# Patient Record
Sex: Female | Born: 1940 | Race: White | Hispanic: No | Smoking: Never smoker
Health system: Southern US, Community
[De-identification: ages and names within clinical notes are randomized; demographics above are authoritative.]

## PROBLEM LIST (undated history)

## (undated) DIAGNOSIS — K219 Gastro-esophageal reflux disease without esophagitis: Secondary | ICD-10-CM

## (undated) DIAGNOSIS — J449 Chronic obstructive pulmonary disease, unspecified: Secondary | ICD-10-CM

## (undated) DIAGNOSIS — M1990 Unspecified osteoarthritis, unspecified site: Secondary | ICD-10-CM

## (undated) DIAGNOSIS — C801 Malignant (primary) neoplasm, unspecified: Secondary | ICD-10-CM

## (undated) DIAGNOSIS — J45909 Unspecified asthma, uncomplicated: Secondary | ICD-10-CM

## (undated) HISTORY — PX: BREAST BIOPSY: SHX20

## (undated) HISTORY — DX: Malignant (primary) neoplasm, unspecified: C80.1

## (undated) HISTORY — DX: Unspecified osteoarthritis, unspecified site: M19.90

## (undated) HISTORY — DX: Chronic obstructive pulmonary disease, unspecified: J44.9

## (undated) HISTORY — PX: OTHER SURGICAL HISTORY: SHX169

## (undated) HISTORY — DX: Unspecified asthma, uncomplicated: J45.909

## (undated) HISTORY — PX: CHOLECYSTECTOMY: SHX55

## (undated) HISTORY — PX: ABDOMINAL HYSTERECTOMY: SHX81

## (undated) HISTORY — PX: KIDNEY SURGERY: SHX687

## (undated) HISTORY — DX: Gastro-esophageal reflux disease without esophagitis: K21.9

---

## 2004-06-11 ENCOUNTER — Ambulatory Visit: Payer: Self-pay | Admitting: Gastroenterology

## 2004-06-12 ENCOUNTER — Ambulatory Visit: Payer: Self-pay | Admitting: Gastroenterology

## 2004-06-14 ENCOUNTER — Ambulatory Visit: Payer: Self-pay | Admitting: Internal Medicine

## 2004-08-15 ENCOUNTER — Ambulatory Visit: Payer: Self-pay | Admitting: Internal Medicine

## 2004-08-30 ENCOUNTER — Ambulatory Visit: Payer: Self-pay | Admitting: Surgery

## 2004-09-02 ENCOUNTER — Ambulatory Visit: Payer: Self-pay | Admitting: Surgery

## 2005-01-20 ENCOUNTER — Ambulatory Visit: Payer: Self-pay

## 2005-06-17 ENCOUNTER — Ambulatory Visit: Payer: Self-pay | Admitting: Internal Medicine

## 2005-06-20 ENCOUNTER — Ambulatory Visit: Payer: Self-pay | Admitting: Internal Medicine

## 2005-07-04 ENCOUNTER — Ambulatory Visit: Payer: Self-pay | Admitting: Internal Medicine

## 2005-07-22 ENCOUNTER — Ambulatory Visit: Payer: Self-pay | Admitting: General Surgery

## 2006-08-10 ENCOUNTER — Ambulatory Visit: Payer: Self-pay | Admitting: Internal Medicine

## 2007-05-28 ENCOUNTER — Encounter: Payer: Self-pay | Admitting: Unknown Physician Specialty

## 2007-06-12 ENCOUNTER — Encounter: Payer: Self-pay | Admitting: Unknown Physician Specialty

## 2007-07-12 ENCOUNTER — Encounter: Payer: Self-pay | Admitting: Unknown Physician Specialty

## 2008-03-22 ENCOUNTER — Ambulatory Visit: Payer: Self-pay | Admitting: Internal Medicine

## 2009-01-09 ENCOUNTER — Ambulatory Visit: Payer: Self-pay | Admitting: Unknown Physician Specialty

## 2009-01-29 ENCOUNTER — Ambulatory Visit: Payer: Self-pay | Admitting: Unknown Physician Specialty

## 2009-09-10 ENCOUNTER — Ambulatory Visit: Payer: Self-pay | Admitting: Internal Medicine

## 2010-08-11 HISTORY — PX: BREAST BIOPSY: SHX20

## 2010-09-24 ENCOUNTER — Ambulatory Visit: Payer: Self-pay | Admitting: Internal Medicine

## 2010-09-25 ENCOUNTER — Ambulatory Visit: Payer: Self-pay | Admitting: Internal Medicine

## 2010-10-14 ENCOUNTER — Ambulatory Visit: Payer: Self-pay | Admitting: Surgery

## 2010-10-16 LAB — PATHOLOGY REPORT

## 2011-10-06 ENCOUNTER — Ambulatory Visit: Payer: Self-pay | Admitting: Internal Medicine

## 2012-02-18 ENCOUNTER — Ambulatory Visit: Payer: Self-pay

## 2012-10-08 ENCOUNTER — Emergency Department: Payer: Self-pay | Admitting: Emergency Medicine

## 2012-10-08 LAB — URINALYSIS, COMPLETE
Bilirubin,UR: NEGATIVE
Ketone: NEGATIVE
Nitrite: NEGATIVE
Ph: 7 (ref 4.5–8.0)
Protein: NEGATIVE
RBC,UR: <1 /HPF (ref 0–5)
Specific Gravity: 1.014 (ref 1.003–1.030)
WBC UR: 1 /HPF (ref 0–5)

## 2012-10-08 LAB — TROPONIN I: Troponin-I: <0.02 ng/mL

## 2012-10-08 LAB — COMPREHENSIVE METABOLIC PANEL
Anion Gap: 4 — ABNORMAL LOW (ref 7–16)
BUN: 21 mg/dL — ABNORMAL HIGH (ref 7–18)
Bilirubin,Total: 0.3 mg/dL (ref 0.2–1.0)
Calcium, Total: 9.2 mg/dL (ref 8.5–10.1)
EGFR (Non-African Amer.): 56 — ABNORMAL LOW
Osmolality: 280 (ref 275–301)
Potassium: 3.8 mmol/L (ref 3.5–5.1)
SGOT(AST): 46 U/L — ABNORMAL HIGH (ref 15–37)
SGPT (ALT): 47 U/L (ref 12–78)

## 2012-10-08 LAB — CBC
HCT: 43.6 % (ref 35.0–47.0)
MCH: 32.2 pg (ref 26.0–34.0)
MCV: 99 fL (ref 80–100)
RBC: 4.41 10*6/uL (ref 3.80–5.20)
WBC: 11.6 10*3/uL — ABNORMAL HIGH (ref 3.6–11.0)

## 2013-01-07 ENCOUNTER — Ambulatory Visit: Payer: Self-pay | Admitting: Internal Medicine

## 2013-11-19 DIAGNOSIS — E039 Hypothyroidism, unspecified: Secondary | ICD-10-CM | POA: Insufficient documentation

## 2013-11-19 DIAGNOSIS — J45909 Unspecified asthma, uncomplicated: Secondary | ICD-10-CM | POA: Insufficient documentation

## 2013-11-19 DIAGNOSIS — M059 Rheumatoid arthritis with rheumatoid factor, unspecified: Secondary | ICD-10-CM | POA: Insufficient documentation

## 2013-11-19 DIAGNOSIS — M1990 Unspecified osteoarthritis, unspecified site: Secondary | ICD-10-CM | POA: Insufficient documentation

## 2013-11-19 DIAGNOSIS — G4730 Sleep apnea, unspecified: Secondary | ICD-10-CM | POA: Insufficient documentation

## 2013-11-19 DIAGNOSIS — E079 Disorder of thyroid, unspecified: Secondary | ICD-10-CM | POA: Insufficient documentation

## 2013-11-19 DIAGNOSIS — K219 Gastro-esophageal reflux disease without esophagitis: Secondary | ICD-10-CM | POA: Insufficient documentation

## 2013-12-01 DIAGNOSIS — Z79899 Other long term (current) drug therapy: Secondary | ICD-10-CM | POA: Insufficient documentation

## 2014-05-26 DIAGNOSIS — F325 Major depressive disorder, single episode, in full remission: Secondary | ICD-10-CM | POA: Insufficient documentation

## 2014-06-21 ENCOUNTER — Ambulatory Visit: Payer: Self-pay | Admitting: Internal Medicine

## 2014-08-09 ENCOUNTER — Ambulatory Visit: Payer: Self-pay | Admitting: Internal Medicine

## 2014-08-14 ENCOUNTER — Encounter: Payer: Self-pay | Admitting: General Surgery

## 2014-08-14 ENCOUNTER — Ambulatory Visit (INDEPENDENT_AMBULATORY_CARE_PROVIDER_SITE_OTHER): Payer: Medicare Other | Admitting: General Surgery

## 2014-08-14 VITALS — BP 110/70 | HR 80 | Resp 14 | Ht 63.0 in | Wt 273.0 lb

## 2014-08-14 DIAGNOSIS — R920 Mammographic microcalcification found on diagnostic imaging of breast: Secondary | ICD-10-CM

## 2014-08-14 NOTE — Progress Notes (Signed)
Patient ID: Tamara Cooper, female   DOB: Sep 29, 1940, 74 y.o.   MRN: [id number]  Chief Complaint  Patient presents with  . Other    abnormal mammogram     HPI Tamara Cooper is a 74 y.o. female who presents for a breast evaluation. The most recent mammogram was done on 06/21/14 with additional views of the right breast on 08/09/14.  Patient does perform regular self breast checks and gets regular mammograms done. The patient states that approximately 2 months ago she tripped up the stairs at home. She states she has had soreness in the this area prior to the fall. The soreness comes and goes. She denies any other issues with the breasts at this time. No known family history of breast cancer.    HPI  Past Medical History  Diagnosis Date  . Asthma   . GERD (gastroesophageal reflux disease)   . Arthritis   . Cancer     skin cancer basil cell    Past Surgical History  Procedure Laterality Date  . Abdominal hysterectomy    . Cholecystectomy    . Kidney surgery    . Basil cell excision Right     right breast  . Breast biopsy Right 1997, 2006    Dystrophic calcifications without malignancy.  . Breast biopsy Left 2012    Dystrophic calcifications without malignancy.    Family History  Problem Relation Age of Onset  . Cancer Father     Social History History  Substance Use Topics  . Smoking status: Never Smoker   . Smokeless tobacco: Never Used  . Alcohol Use: No    Allergies  Allergen Reactions  . Doxycycline Nausea Only  . Eggs Or Egg-Derived Products Other (See Comments)    GI bloating  . Sulfa Antibiotics Nausea Only  . Valsartan Cough  . Diltiazem Rash    Current Outpatient Prescriptions  Medication Sig Dispense Refill  . albuterol (PROAIR HFA) 108 (90 BASE) MCG/ACT inhaler TAKE 2 PUFFS 4 TIMES A DAY AS NEEDED    . esomeprazole (NEXIUM) 40 MG capsule Take by mouth.    Marland Kitchen FLUoxetine (PROZAC) 20 MG capsule TAKE 1 CAPSULE EVERY DAY    . folic acid (FOLVITE) 1 MG  tablet 1mg  - 2 tablets by mouth once day.    . furosemide (LASIX) 20 MG tablet     . levothyroxine (SYNTHROID, LEVOTHROID) 125 MCG tablet Take by mouth.    . losartan-hydrochlorothiazide (HYZAAR) 100-25 MG per tablet TAKE ONE TABLET EVERY DAY    . methotrexate (RHEUMATREX) 2.5 MG tablet TAKE 6 TABLETS ON SAME DAY EACH WEEK    . montelukast (SINGULAIR) 10 MG tablet TAKE ONE TABLET BY MOUTH EVERY DAY    . Multiple Vitamin (MULTI-VITAMINS) TABS Take by mouth.    . traMADol (ULTRAM) 50 MG tablet Take by mouth.     No current facility-administered medications for this visit.    Review of Systems Review of Systems  Constitutional: Negative.   Respiratory: Negative.   Cardiovascular: Negative.     Blood pressure 110/70, pulse 80, resp. rate 14, height 5\' 3"  (1.6 m), weight 273 lb (123.832 kg).  Physical Exam Physical Exam  Constitutional: She is oriented to person, place, and time. She appears well-developed and well-nourished.  Neck: Neck supple. No thyromegaly present.  Cardiovascular: Normal rate, regular rhythm and normal heart sounds.   No murmur heard. Pulmonary/Chest: Effort normal. She has rhonchi in the right lower field. Right breast exhibits no inverted nipple,  no mass, no nipple discharge, no skin change and no tenderness. Left breast exhibits no inverted nipple, no mass, no nipple discharge, no skin change and no tenderness.  Lymphadenopathy:    She has no cervical adenopathy.    She has no axillary adenopathy.  Neurological: She is alert and oriented to person, place, and time.  Skin: Skin is warm and dry.    Data Reviewed Biopsy results from 1997, 2006 and 2012 were reviewed.  Mammograms from 06/21/2014 as well as focal spot compression views of the right breast dated 08/09/2014 were reviewed. BI-RADS-4.  Independent review protamine 2012 and 2014 shows a slowly developing area of microcalcifications of similar area but with increasing number of  calcifications.  Assessment    Right breast microcalcifications, history multiple falls, multiple previous biopsies.    Plan    Options for management were reviewed: 1) early stereotactic biopsy versus 2) a 6 month follow-up exam. His of each plan were reviewed. Little risk for missed malignancy or upstaging in short interval follow-up. At this time the patient desires observation. We'll arrange for a follow-up right breast diagnostic mammogram in May 2016 with an office visit to follow.    PCP/Ref MD:  Irish Elders 08/14/2014, 7:39 PM

## 2014-08-14 NOTE — Patient Instructions (Addendum)
Patient to return in 4 months with right diagnostic mammogram. Continue self breast exams. Call office for any new breast issues or concerns.

## 2014-08-18 ENCOUNTER — Ambulatory Visit: Payer: Self-pay | Admitting: Gastroenterology

## 2014-11-24 ENCOUNTER — Other Ambulatory Visit: Payer: Self-pay | Admitting: General Surgery

## 2014-11-24 DIAGNOSIS — R921 Mammographic calcification found on diagnostic imaging of breast: Secondary | ICD-10-CM

## 2014-12-04 LAB — SURGICAL PATHOLOGY

## 2014-12-25 ENCOUNTER — Other Ambulatory Visit: Payer: Self-pay | Admitting: General Surgery

## 2014-12-25 ENCOUNTER — Ambulatory Visit: Admission: RE | Admit: 2014-12-25 | Payer: Self-pay | Source: Ambulatory Visit

## 2014-12-25 ENCOUNTER — Ambulatory Visit
Admission: RE | Admit: 2014-12-25 | Discharge: 2014-12-25 | Disposition: A | Discharge status: Home / Self Care | Payer: Medicare Other | Source: Ambulatory Visit | Attending: General Surgery | Admitting: General Surgery

## 2014-12-25 DIAGNOSIS — R921 Mammographic calcification found on diagnostic imaging of breast: Secondary | ICD-10-CM | POA: Insufficient documentation

## 2014-12-26 DIAGNOSIS — M069 Rheumatoid arthritis, unspecified: Secondary | ICD-10-CM | POA: Insufficient documentation

## 2015-01-03 ENCOUNTER — Encounter: Payer: Self-pay | Admitting: General Surgery

## 2015-01-03 ENCOUNTER — Ambulatory Visit: Payer: Medicare Other | Admitting: General Surgery

## 2015-01-03 ENCOUNTER — Ambulatory Visit (INDEPENDENT_AMBULATORY_CARE_PROVIDER_SITE_OTHER): Payer: Medicare Other | Admitting: General Surgery

## 2015-01-03 VITALS — BP 160/78 | HR 74 | Resp 16 | Ht 63.0 in | Wt 271.0 lb

## 2015-01-03 DIAGNOSIS — R920 Mammographic microcalcification found on diagnostic imaging of breast: Secondary | ICD-10-CM

## 2015-01-03 NOTE — Progress Notes (Signed)
Patient ID: Tamara Cooper, female   DOB: 02-11-1941, 74 y.o.   MRN: [id number]  Chief Complaint  Patient presents with  . Follow-up    mammogram    HPI Tamara Cooper is a 74 y.o. female who presents for a breast evaluation. The most recent right diagnotic mammogram was done on 12/25/14.  Patient does perform regular self breast checks and gets regular mammograms done.    HPI  Past Medical History  Diagnosis Date  . Asthma   . GERD (gastroesophageal reflux disease)   . Arthritis   . Cancer     skin cancer basil cell    Past Surgical History  Procedure Laterality Date  . Abdominal hysterectomy    . Cholecystectomy    . Kidney surgery    . Basil cell excision Right     right breast  . Breast biopsy Right 1997, 2006    Dystrophic calcifications without malignancy.  . Breast biopsy Left 2012    Dystrophic calcifications without malignancy.    Family History  Problem Relation Age of Onset  . Cancer Father     Social History History  Substance Use Topics  . Smoking status: Never Smoker   . Smokeless tobacco: Never Used  . Alcohol Use: No    Allergies  Allergen Reactions  . Doxycycline Nausea Only  . Eggs Or Egg-Derived Products Other (See Comments)    GI bloating  . Sulfa Antibiotics Nausea Only  . Valsartan Cough  . Diltiazem Rash    Current Outpatient Prescriptions  Medication Sig Dispense Refill  . albuterol (PROAIR HFA) 108 (90 BASE) MCG/ACT inhaler TAKE 2 PUFFS 4 TIMES A DAY AS NEEDED    . esomeprazole (NEXIUM) 40 MG capsule Take by mouth.    Marland Kitchen FLUoxetine (PROZAC) 20 MG capsule TAKE 1 CAPSULE EVERY DAY    . folic acid (FOLVITE) 1 MG tablet 1mg  - 2 tablets by mouth once day.    . furosemide (LASIX) 20 MG tablet     . levothyroxine (SYNTHROID, LEVOTHROID) 125 MCG tablet Take by mouth.    . losartan-hydrochlorothiazide (HYZAAR) 100-25 MG per tablet TAKE ONE TABLET EVERY DAY    . methotrexate (RHEUMATREX) 2.5 MG tablet TAKE 6 TABLETS ON SAME DAY EACH WEEK     . montelukast (SINGULAIR) 10 MG tablet TAKE ONE TABLET BY MOUTH EVERY DAY    . Multiple Vitamin (MULTI-VITAMINS) TABS Take by mouth.    . traMADol (ULTRAM) 50 MG tablet Take by mouth.     No current facility-administered medications for this visit.    Review of Systems Review of Systems  Constitutional: Negative.   Respiratory: Negative.   Cardiovascular: Negative.     Blood pressure 160/78, pulse 74, resp. rate 16, height 5\' 3"  (1.6 m), weight 271 lb (122.925 kg).  Physical Exam Physical Exam  Constitutional: She is oriented to person, place, and time. She appears well-developed and well-nourished.  Eyes: Conjunctivae are normal. No scleral icterus.  Neck: Neck supple.  Cardiovascular: Normal rate, regular rhythm and normal heart sounds.   Pulmonary/Chest: Effort normal and breath sounds normal. Right breast exhibits no inverted nipple, no mass, no nipple discharge, no skin change and no tenderness. Left breast exhibits no inverted nipple, no mass, no nipple discharge, no skin change and no tenderness.  Lymphadenopathy:    She has no cervical adenopathy.    She has no axillary adenopathy.  Neurological: She is alert and oriented to person, place, and time.  Skin: Skin is warm  and dry.    Data Reviewed Right breast diagnostic mammogram dated 12/25/2014 was independently reviewed. Radiologist report reviewed.  Stable to possibly increased dystrophic calcifications, BI-RADS-4.  Assessment    Right breast microcalcifications, developing over 3 years.    Plan    We had a long discussion including the pros and cons of early biopsy versus an observation.  The patient has had 3 previous biopsies for suspicious microcalcifications, no previous pathology. At this time, we'll plan for a follow-up exam in 6 months.   Patient to return in November 2016 for a bilateral diagnotic mammogram.     PCP:  Cephas Darby, [place] Gleason 01/04/2015, 7:52 AM

## 2015-01-03 NOTE — Patient Instructions (Signed)
Patient to return in November 2016 for a bilateral diagnotic mammogram.

## 2015-03-23 DIAGNOSIS — Z0000 Encounter for general adult medical examination without abnormal findings: Secondary | ICD-10-CM | POA: Insufficient documentation

## 2015-05-03 ENCOUNTER — Other Ambulatory Visit: Payer: Self-pay

## 2015-05-03 DIAGNOSIS — R920 Mammographic microcalcification found on diagnostic imaging of breast: Secondary | ICD-10-CM

## 2015-06-01 ENCOUNTER — Other Ambulatory Visit: Payer: Self-pay | Admitting: Family Medicine

## 2015-06-01 DIAGNOSIS — M25562 Pain in left knee: Secondary | ICD-10-CM

## 2015-06-01 DIAGNOSIS — S8002XA Contusion of left knee, initial encounter: Secondary | ICD-10-CM

## 2015-06-04 ENCOUNTER — Ambulatory Visit
Admission: RE | Admit: 2015-06-04 | Discharge: 2015-06-04 | Disposition: A | Discharge status: Home / Self Care | Payer: Medicare Other | Source: Ambulatory Visit | Attending: Family Medicine | Admitting: Family Medicine

## 2015-06-04 DIAGNOSIS — S8002XA Contusion of left knee, initial encounter: Secondary | ICD-10-CM | POA: Insufficient documentation

## 2015-06-04 DIAGNOSIS — S83232A Complex tear of medial meniscus, current injury, left knee, initial encounter: Secondary | ICD-10-CM | POA: Insufficient documentation

## 2015-06-04 DIAGNOSIS — R262 Difficulty in walking, not elsewhere classified: Secondary | ICD-10-CM | POA: Diagnosis present

## 2015-06-04 DIAGNOSIS — M67864 Other specified disorders of tendon, left knee: Secondary | ICD-10-CM | POA: Insufficient documentation

## 2015-06-04 DIAGNOSIS — M948X6 Other specified disorders of cartilage, lower leg: Secondary | ICD-10-CM | POA: Insufficient documentation

## 2015-06-04 DIAGNOSIS — M25562 Pain in left knee: Secondary | ICD-10-CM | POA: Diagnosis present

## 2015-06-25 ENCOUNTER — Other Ambulatory Visit: Payer: Self-pay | Admitting: Rheumatology

## 2015-06-25 ENCOUNTER — Encounter: Payer: Self-pay | Admitting: General Surgery

## 2015-06-25 DIAGNOSIS — R945 Abnormal results of liver function studies: Secondary | ICD-10-CM

## 2015-06-25 DIAGNOSIS — R7989 Other specified abnormal findings of blood chemistry: Secondary | ICD-10-CM

## 2015-06-26 ENCOUNTER — Ambulatory Visit
Admission: RE | Admit: 2015-06-26 | Discharge: 2015-06-26 | Disposition: A | Discharge status: Home / Self Care | Payer: Medicare Other | Source: Ambulatory Visit | Attending: General Surgery | Admitting: General Surgery

## 2015-06-26 DIAGNOSIS — R920 Mammographic microcalcification found on diagnostic imaging of breast: Secondary | ICD-10-CM

## 2015-06-26 DIAGNOSIS — R921 Mammographic calcification found on diagnostic imaging of breast: Secondary | ICD-10-CM | POA: Insufficient documentation

## 2015-06-29 ENCOUNTER — Ambulatory Visit: Payer: Medicare Other

## 2015-07-02 ENCOUNTER — Ambulatory Visit (INDEPENDENT_AMBULATORY_CARE_PROVIDER_SITE_OTHER): Payer: Medicare Other | Admitting: General Surgery

## 2015-07-02 ENCOUNTER — Encounter: Payer: Self-pay | Admitting: General Surgery

## 2015-07-02 VITALS — BP 140/73 | HR 72 | Resp 18 | Ht 63.0 in | Wt 278.0 lb

## 2015-07-02 DIAGNOSIS — R920 Mammographic microcalcification found on diagnostic imaging of breast: Secondary | ICD-10-CM | POA: Diagnosis not present

## 2015-07-02 NOTE — Patient Instructions (Addendum)
Follow up in one year  Continue self breast exams. Call office for any new breast issues or concerns.  

## 2015-07-02 NOTE — Progress Notes (Signed)
Patient ID: Tamara Cooper, female   DOB: 1940-10-30, 74 y.o.   MRN: [id number]  Chief Complaint  Patient presents with  . Follow-up    mammogram    HPI Tamara Cooper is a 74 y.o. female who presents for a breast evaluation. The most recent bilateral mammogram was done on 06/26/15. Patient does perform regular self breast checks and gets regular mammograms done. She denies any new breast problems.    HPI  Past Medical History  Diagnosis Date  . Asthma   . GERD (gastroesophageal reflux disease)   . Arthritis   . Cancer ([place])     skin cancer basil cell    Past Surgical History  Procedure Laterality Date  . Abdominal hysterectomy    . Cholecystectomy    . Kidney surgery    . Basil cell excision Right     right breast  . Breast biopsy Right 1997, 2006    Dystrophic calcifications without malignancy.  . Breast biopsy Left 2012    Dystrophic calcifications without malignancy.    Family History  Problem Relation Age of Onset  . Cancer Father     Social History Social History  Substance Use Topics  . Smoking status: Never Smoker   . Smokeless tobacco: Never Used  . Alcohol Use: No    Allergies  Allergen Reactions  . Doxycycline Nausea Only  . Eggs Or Egg-Derived Products Other (See Comments)    GI bloating  . Sulfa Antibiotics Nausea Only  . Valsartan Cough  . Diltiazem Rash    Current Outpatient Prescriptions  Medication Sig Dispense Refill  . albuterol (PROAIR HFA) 108 (90 BASE) MCG/ACT inhaler TAKE 2 PUFFS 4 TIMES A DAY AS NEEDED    . esomeprazole (NEXIUM) 40 MG capsule Take by mouth.    Marland Kitchen FLUoxetine (PROZAC) 20 MG capsule TAKE 1 CAPSULE EVERY DAY    . folic acid (FOLVITE) 1 MG tablet 1mg  - 2 tablets by mouth once day.    . furosemide (LASIX) 20 MG tablet     . levothyroxine (SYNTHROID, LEVOTHROID) 125 MCG tablet Take by mouth.    . losartan-hydrochlorothiazide (HYZAAR) 100-25 MG per tablet TAKE ONE TABLET EVERY DAY    . methotrexate (RHEUMATREX) 2.5 MG  tablet TAKE 6 TABLETS ON SAME DAY EACH WEEK    . montelukast (SINGULAIR) 10 MG tablet TAKE ONE TABLET BY MOUTH EVERY DAY    . Multiple Vitamin (MULTI-VITAMINS) TABS Take by mouth.    . traMADol (ULTRAM) 50 MG tablet Take by mouth.     No current facility-administered medications for this visit.    Review of Systems Review of Systems  Constitutional: Negative.   Respiratory: Positive for shortness of breath (mild, attributed to asthma. Exacerbation with recent weather change.).   Cardiovascular: Negative.     Blood pressure 140/73, pulse 72, resp. rate 18, height 5\' 3"  (1.6 m), weight 278 lb (126.1 kg).   The patient's weight is up 5 pounds from her January 2016 exam.  Physical Exam Physical Exam  Constitutional: She is oriented to person, place, and time. She appears well-developed and well-nourished.  Eyes: Conjunctivae are normal. No scleral icterus.  Neck: Neck supple.  Cardiovascular: Normal rate, regular rhythm and normal heart sounds.   Pulmonary/Chest: Effort normal. She has rales in the right lower field. Right breast exhibits no inverted nipple, no mass, no nipple discharge, no skin change and no tenderness. Left breast exhibits no inverted nipple, no mass, no nipple discharge, no skin change and  no tenderness. Breasts are asymmetrical (right 1 cup size larger than left).  Lymphadenopathy:    She has no cervical adenopathy.    She has no axillary adenopathy.  Neurological: She is alert and oriented to person, place, and time.  Skin: Skin is warm and dry.  Psychiatric: She has a normal mood and affect.    Data Reviewed Bilateral diagnostic mammograms dated 06/26/2015 were independently reviewed and compared to previous studies. Stable microcalcifications in the right breast. Scattered calcifications in the left. Body of report recommends 1 year follow-up, final BIRAD-3.  Assessment    Microcalcifications of the right breast. Few rales in the right lower lobe, left  clear. Main airways clear.    Plan    Options for management were reviewed: 1) early follow-up right breast mammogram to complete 6 month follow-ups 2 years versus 2) final follow-up diagnostic mammogram in one year. The patient and myself are comfortable with the latter.  We'll arrange for bilateral diagnostic mammograms in one year with office visit to follow.  The patient will continue her regular inhalers and report worsening shortness of breath to Dr. Ouida Sills.    PCP: Vergia Alberts 07/02/2015, 10:51 AM

## 2015-07-12 ENCOUNTER — Ambulatory Visit
Admission: RE | Admit: 2015-07-12 | Discharge: 2015-07-12 | Disposition: A | Discharge status: Home / Self Care | Payer: Medicare Other | Source: Ambulatory Visit | Attending: Rheumatology | Admitting: Rheumatology

## 2015-07-12 DIAGNOSIS — R7989 Other specified abnormal findings of blood chemistry: Secondary | ICD-10-CM | POA: Diagnosis present

## 2015-07-12 DIAGNOSIS — K7689 Other specified diseases of liver: Secondary | ICD-10-CM | POA: Insufficient documentation

## 2015-07-12 DIAGNOSIS — R945 Abnormal results of liver function studies: Secondary | ICD-10-CM

## 2015-07-18 ENCOUNTER — Other Ambulatory Visit: Payer: Self-pay | Admitting: Rheumatology

## 2015-07-18 DIAGNOSIS — R93429 Abnormal radiologic findings on diagnostic imaging of unspecified kidney: Secondary | ICD-10-CM

## 2015-08-15 ENCOUNTER — Ambulatory Visit
Admission: RE | Admit: 2015-08-15 | Discharge: 2015-08-15 | Disposition: A | Discharge status: Home / Self Care | Payer: Medicare Other | Source: Ambulatory Visit | Attending: Rheumatology | Admitting: Rheumatology

## 2015-08-15 DIAGNOSIS — K760 Fatty (change of) liver, not elsewhere classified: Secondary | ICD-10-CM | POA: Diagnosis not present

## 2015-08-15 DIAGNOSIS — R93429 Abnormal radiologic findings on diagnostic imaging of unspecified kidney: Secondary | ICD-10-CM | POA: Diagnosis present

## 2015-08-15 DIAGNOSIS — R160 Hepatomegaly, not elsewhere classified: Secondary | ICD-10-CM | POA: Insufficient documentation

## 2015-08-15 MED ORDER — GADOBENATE DIMEGLUMINE 529 MG/ML IV SOLN
10.0000 mL | Freq: Once | INTRAVENOUS | Status: AC | PRN
  Administered 2015-08-15: 10 mL via INTRAVENOUS

## 2015-08-28 ENCOUNTER — Encounter: Payer: Self-pay | Admitting: Urology

## 2015-08-28 ENCOUNTER — Ambulatory Visit (INDEPENDENT_AMBULATORY_CARE_PROVIDER_SITE_OTHER): Payer: Medicare Other | Admitting: Urology

## 2015-08-28 VITALS — BP 164/80 | HR 98 | Resp 18 | Ht 63.0 in | Wt 276.7 lb

## 2015-08-28 DIAGNOSIS — N2889 Other specified disorders of kidney and ureter: Secondary | ICD-10-CM | POA: Diagnosis not present

## 2015-08-28 DIAGNOSIS — R3915 Urgency of urination: Secondary | ICD-10-CM | POA: Diagnosis not present

## 2015-08-28 LAB — URINALYSIS, COMPLETE
Bilirubin, UA: NEGATIVE
Glucose, UA: NEGATIVE
Ketones, UA: NEGATIVE
LEUKOCYTES UA: NEGATIVE
Nitrite, UA: NEGATIVE
PH UA: 7 (ref 5.0–7.5)
PROTEIN UA: NEGATIVE
RBC, UA: NEGATIVE
SPEC GRAV UA: 1.01 (ref 1.005–1.030)
Urobilinogen, Ur: 0.2 mg/dL (ref 0.2–1.0)

## 2015-08-28 LAB — MICROSCOPIC EXAMINATION: RBC, UA: NONE SEEN /hpf (ref 0–?)

## 2015-08-28 NOTE — Progress Notes (Signed)
08/28/2015 3:17 PM   Tamara Cooper 06-06-1941 [id number]  Referring provider: Kirk Ruths, MD [facility] [place], [place] [address]  Chief Complaint  Patient presents with  . Other  . Renal mass    HPI: Tamara Cooper is a 75yo seen in consultation today for a left renal mass. Her renal mass was found on workup for elevated liver enzymes and elevated creatinine. 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 It has similar echotexture to the upper pole. She has a hx of open renal stone extraction when she was in her 20-30s. 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   She has urgency daily. She has frequency q6-8 hours. She denies dysuria or hematuria.    PMH: Past Medical History  Diagnosis Date  . Asthma   . GERD (gastroesophageal reflux disease)   . Arthritis   . Cancer ([place])     skin cancer basil cell    Surgical History: Past Surgical History  Procedure Laterality Date  . Abdominal hysterectomy    . Cholecystectomy    . Kidney surgery    . Basil cell excision Right     right breast  . Breast biopsy Right 1997, 2006    Dystrophic calcifications without malignancy.  . Breast biopsy Left 2012    Dystrophic calcifications without malignancy.    Home Medications:    Medication List       This list is accurate as of: 08/28/15  3:17 PM.  Always use your most recent med list.               esomeprazole 40 MG capsule  Commonly known as:  NEXIUM  Take by mouth.     FLUoxetine 20 MG capsule  Commonly known as:  PROZAC  Reported on [id number]     folic acid 1 MG tablet  Commonly known as:  FOLVITE  1mg  - 2 tablets by mouth once day.     furosemide 20 MG tablet  Commonly known as:  LASIX     levothyroxine 125 MCG tablet  Commonly known as:  SYNTHROID, LEVOTHROID  Take by mouth.     losartan-hydrochlorothiazide 100-25 MG tablet    Commonly known as:  HYZAAR  TAKE ONE TABLET EVERY DAY     montelukast 10 MG tablet  Commonly known as:  SINGULAIR     MULTI-VITAMINS Tabs  Take by mouth.     PROAIR HFA 108 (90 Base) MCG/ACT inhaler  Generic drug:  albuterol  TAKE 2 PUFFS 4 TIMES A DAY AS NEEDED     traMADol 50 MG tablet  Commonly known as:  ULTRAM  Take by mouth.     VITAMIN D-1000 MAX ST 1000 units tablet  Generic drug:  Cholecalciferol  Take by mouth.        Allergies:  Allergies  Allergen Reactions  . Doxycycline Nausea Only  . Eggs Or Egg-Derived Products Other (See Comments)    GI bloating  . Sulfa Antibiotics Nausea Only  . Valsartan Cough and Other (See Comments)  . Aspirin Rash    Mouth ulcers  . Diltiazem Rash  . Diltiazem Hcl Rash    Family History: Family History  Problem Relation Age of Onset  . Cancer Father     Social History:  reports that she has never smoked. She has never used smokeless tobacco. She  reports that she does not drink alcohol or use illicit drugs.  ROS: UROLOGY Frequent Urination?: No Hard to postpone urination?: Yes Burning/pain with urination?: Yes Get up at night to urinate?: No Leakage of urine?: Yes Urine stream starts and stops?: No Trouble starting stream?: No Do you have to strain to urinate?: No Blood in urine?: No Urinary tract infection?: Yes Sexually transmitted disease?: No Injury to kidneys or bladder?: No Painful intercourse?: No Weak stream?: No Currently pregnant?: No Vaginal bleeding?: No Last menstrual period?: n  Gastrointestinal Nausea?: Yes Vomiting?: No Indigestion/heartburn?: Yes Diarrhea?: No Constipation?: No  Constitutional Fever: No Night sweats?: No Weight loss?: No Fatigue?: Yes  Skin Skin rash/lesions?: Yes Itching?: Yes  Eyes Blurred vision?: No Double vision?: No  Ears/Nose/Throat Sore throat?: No Sinus problems?: Yes  Hematologic/Lymphatic Swollen glands?: No Easy bruising?:  Yes  Cardiovascular Leg swelling?: Yes Chest pain?: No  Respiratory Cough?: Yes Shortness of breath?: Yes  Endocrine Excessive thirst?: No  Musculoskeletal Back pain?: No Joint pain?: Yes  Neurological Headaches?: Yes Dizziness?: Yes  Psychologic Depression?: Yes Anxiety?: No  Physical Exam: BP 164/80 mmHg  Pulse 98  Resp 18  Ht 5\' 3"  (1.6 m)  Wt 125.51 kg (276 lb 11.2 oz)  BMI 49.03 kg/m2  Constitutional:  Alert and oriented, No acute distress. HEENT: [place] AT, moist mucus membranes.  Trachea midline, no masses. Cardiovascular: No clubbing, cyanosis, or edema. Respiratory: Normal respiratory effort, no increased work of breathing. GI: Abdomen is soft, nontender, nondistended, no abdominal masses GU: No CVA tenderness.  Skin: No rashes, bruises or suspicious lesions. Lymph: No cervical or inguinal adenopathy. Neurologic: Grossly intact, no focal deficits, moving all 4 extremities. Psychiatric: Normal mood and affect.  Laboratory Data: Lab Results  Component Value Date   WBC 11.6* 10/08/2012   HGB 14.2 10/08/2012   HCT 43.6 10/08/2012   MCV 99 10/08/2012   PLT 277 10/08/2012    Lab Results  Component Value Date   CREATININE 0.99 10/08/2012    No results found for: PSA  No results found for: TESTOSTERONE  No results found for: HGBA1C  Urinalysis    Component Value Date/Time   COLORURINE Yellow 10/08/2012 1828   APPEARANCEUR Clear 10/08/2012 1828   LABSPEC 1.014 10/08/2012 1828   PHURINE 7.0 10/08/2012 1828   GLUCOSEU Negative 10/08/2012 1828   HGBUR Negative 10/08/2012 1828   BILIRUBINUR Negative 10/08/2012 1828   KETONESUR Negative 10/08/2012 1828   PROTEINUR Negative 10/08/2012 1828   NITRITE Negative 10/08/2012 1828   LEUKOCYTESUR Negative 10/08/2012 1828    Pertinent Imaging: MRI abd  Assessment & Plan:    1. Renal mass -MRI abd in 6-9 months - Urinalysis, Complete  2. Urinary urgency -timed voiding  No Follow-up on  file.  Cleon Gustin, [facility] [address] [place], [place] [address] [phone]

## 2015-08-29 DIAGNOSIS — N2889 Other specified disorders of kidney and ureter: Secondary | ICD-10-CM | POA: Insufficient documentation

## 2015-08-29 DIAGNOSIS — R3915 Urgency of urination: Secondary | ICD-10-CM | POA: Insufficient documentation

## 2016-02-25 ENCOUNTER — Ambulatory Visit
Admission: RE | Admit: 2016-02-25 | Discharge: 2016-02-25 | Disposition: A | Discharge status: Home / Self Care | Payer: Medicare Other | Source: Ambulatory Visit | Attending: Urology | Admitting: Urology

## 2016-02-25 DIAGNOSIS — N2889 Other specified disorders of kidney and ureter: Secondary | ICD-10-CM

## 2016-03-04 ENCOUNTER — Ambulatory Visit: Payer: Medicare Other

## 2016-03-11 ENCOUNTER — Ambulatory Visit
Admission: RE | Admit: 2016-03-11 | Discharge: 2016-03-11 | Disposition: A | Discharge status: Home / Self Care | Payer: Medicare Other | Source: Ambulatory Visit | Attending: Urology | Admitting: Urology

## 2016-03-11 DIAGNOSIS — Z9049 Acquired absence of other specified parts of digestive tract: Secondary | ICD-10-CM | POA: Insufficient documentation

## 2016-03-11 DIAGNOSIS — N2889 Other specified disorders of kidney and ureter: Secondary | ICD-10-CM | POA: Insufficient documentation

## 2016-03-11 DIAGNOSIS — K760 Fatty (change of) liver, not elsewhere classified: Secondary | ICD-10-CM | POA: Insufficient documentation

## 2016-03-11 MED ORDER — GADOBENATE DIMEGLUMINE 529 MG/ML IV SOLN
20.0000 mL | Freq: Once | INTRAVENOUS | Status: AC | PRN
  Administered 2016-03-11: 20 mL via INTRAVENOUS

## 2016-03-18 ENCOUNTER — Encounter: Payer: Self-pay | Admitting: Urology

## 2016-03-18 ENCOUNTER — Ambulatory Visit (INDEPENDENT_AMBULATORY_CARE_PROVIDER_SITE_OTHER): Payer: Medicare Other | Admitting: Urology

## 2016-03-18 VITALS — BP 147/86 | HR 84 | Ht 63.0 in | Wt 268.0 lb

## 2016-03-18 DIAGNOSIS — N2889 Other specified disorders of kidney and ureter: Secondary | ICD-10-CM

## 2016-03-18 NOTE — Progress Notes (Signed)
03/18/2016 9:55 AM   Tamara Cooper Dec 25, 1940 [id number]  Referring provider: Kirk Ruths, MD [facility] [place], [place] [address]  No chief complaint on file.   HPI: Ms Ashworth is a 75yo seen in followup for a left renal mass. She underwent MRI which was not concerning for a left renal mass. She underwent open renal surgery several years ago nephrolithiasis.  She has urgency and occasional urge incontinence. She has frequency q 5 hours.    She takes lasix 40mg    PMH: Past Medical History:  Diagnosis Date  . Arthritis   . Asthma   . Cancer ([place])    skin cancer basil cell  . GERD (gastroesophageal reflux disease)     Surgical History: Past Surgical History:  Procedure Laterality Date  . ABDOMINAL HYSTERECTOMY    . basil cell excision Right    right breast  . BREAST BIOPSY Right 1997, 2006   Dystrophic calcifications without malignancy.  Marland Kitchen BREAST BIOPSY Left 2012   Dystrophic calcifications without malignancy.  . CHOLECYSTECTOMY    . KIDNEY SURGERY      Home Medications:    Medication List       Accurate as of 03/18/16  9:55 AM. Always use your most recent med list.          esomeprazole 40 MG capsule Commonly known as:  NEXIUM Take by mouth.   FLUoxetine 20 MG capsule Commonly known as:  PROZAC Reported on [id number]   folic acid 1 MG tablet Commonly known as:  FOLVITE 1mg  - 2 tablets by mouth once day.   furosemide 20 MG tablet Commonly known as:  LASIX   levothyroxine 125 MCG tablet Commonly known as:  SYNTHROID, LEVOTHROID Take by mouth.   losartan-hydrochlorothiazide 100-25 MG tablet Commonly known as:  HYZAAR TAKE ONE TABLET EVERY DAY   montelukast 10 MG tablet Commonly known as:  SINGULAIR   MULTI-VITAMINS Tabs Take by mouth.   PROAIR HFA 108 (90 Base) MCG/ACT inhaler Generic drug:  albuterol TAKE 2 PUFFS 4 TIMES A DAY AS NEEDED   traMADol 50 MG tablet Commonly known as:  ULTRAM Take by  mouth.   VITAMIN D-1000 MAX ST 1000 units tablet Generic drug:  Cholecalciferol Take by mouth.       Allergies:  Allergies  Allergen Reactions  . Doxycycline Nausea Only  . Eggs Or Egg-Derived Products Other (See Comments)    GI bloating  . Sulfa Antibiotics Nausea Only  . Valsartan Cough and Other (See Comments)  . Aspirin Rash    Mouth ulcers  . Diltiazem Rash  . Diltiazem Hcl Rash    Family History: Family History  Problem Relation Age of Onset  . Cancer Father     Social History:  reports that she has never smoked. She has never used smokeless tobacco. She reports that she does not drink alcohol or use drugs.  ROS: UROLOGY Frequent Urination?: No Hard to postpone urination?: No Burning/pain with urination?: No Get up at night to urinate?: Yes Leakage of urine?: Yes Urine stream starts and stops?: No Trouble starting stream?: No Do you have to strain to urinate?: No Blood in urine?: No Urinary tract infection?: No Sexually transmitted disease?: No Injury to kidneys or bladder?: No Painful intercourse?: No Weak stream?: No Currently pregnant?: No Vaginal bleeding?: No Last menstrual period?: No  Gastrointestinal Nausea?: Yes Vomiting?: No Indigestion/heartburn?: No Diarrhea?: No Constipation?: No  Constitutional Fever: No Night sweats?: No Weight  loss?: No Fatigue?: Yes  Skin Skin rash/lesions?: Yes Itching?: Yes  Eyes Blurred vision?: No Double vision?: No  Ears/Nose/Throat Sore throat?: No Sinus problems?: Yes  Hematologic/Lymphatic Swollen glands?: No Easy bruising?: No  Cardiovascular Leg swelling?: Yes Chest pain?: No  Respiratory Cough?: Yes Shortness of breath?: Yes  Endocrine Excessive thirst?: No  Musculoskeletal Back pain?: Yes Joint pain?: Yes  Neurological Headaches?: Yes Dizziness?: No  Psychologic Depression?: Yes Anxiety?: No  Physical Exam: BP (!) 147/86   Pulse 84   Ht 5\' 3"  (1.6 m)   Wt  121.6 kg (268 lb)   BMI 47.47 kg/m   Constitutional:  Alert and oriented, No acute distress. HEENT: [place] AT, moist mucus membranes.  Trachea midline, no masses. Cardiovascular: No clubbing, cyanosis, or edema. Respiratory: Normal respiratory effort, no increased work of breathing. GI: Abdomen is soft, nontender, nondistended, no abdominal masses GU: No CVA tenderness.  Skin: No rashes, bruises or suspicious lesions. Lymph: No cervical or inguinal adenopathy. Neurologic: Grossly intact, no focal deficits, moving all 4 extremities. Psychiatric: Normal mood and affect.  Laboratory Data: Lab Results  Component Value Date   WBC 11.6 (H) 10/08/2012   HGB 14.2 10/08/2012   HCT 43.6 10/08/2012   MCV 99 10/08/2012   PLT 277 10/08/2012    Lab Results  Component Value Date   CREATININE 0.99 10/08/2012    No results found for: PSA  No results found for: TESTOSTERONE  No results found for: HGBA1C  Urinalysis    Component Value Date/Time   COLORURINE Yellow 10/08/2012 1828   APPEARANCEUR Clear 08/28/2015 1509   LABSPEC 1.014 10/08/2012 1828   PHURINE 7.0 10/08/2012 1828   GLUCOSEU Negative 08/28/2015 1509   GLUCOSEU Negative 10/08/2012 1828   HGBUR Negative 10/08/2012 1828   BILIRUBINUR Negative 08/28/2015 1509   BILIRUBINUR Negative 10/08/2012 1828   KETONESUR Negative 10/08/2012 1828   PROTEINUR Negative 08/28/2015 1509   PROTEINUR Negative 10/08/2012 1828   NITRITE Negative 08/28/2015 1509   NITRITE Negative 10/08/2012 1828   LEUKOCYTESUR Negative 08/28/2015 1509   LEUKOCYTESUR Negative 10/08/2012 1828    Pertinent Imaging: MRI abd w/wo contrast  Assessment & Plan:   1. Left renal mass: Benign kidney tissue on MRI -RTC prn  2. Urinary urgency -timed voiding  There are no diagnoses linked to this encounter.  No Follow-up on file.  Nicolette Bang, MD  [facility] [address], [place], [place] [address] [phone]

## 2016-05-15 ENCOUNTER — Other Ambulatory Visit: Payer: Self-pay | Admitting: General Surgery

## 2016-05-15 DIAGNOSIS — R920 Mammographic microcalcification found on diagnostic imaging of breast: Secondary | ICD-10-CM

## 2016-05-22 ENCOUNTER — Encounter: Payer: Self-pay | Admitting: *Deleted

## 2016-06-24 ENCOUNTER — Ambulatory Visit: Payer: Medicare Other | Attending: Specialist

## 2016-06-24 DIAGNOSIS — G4761 Periodic limb movement disorder: Secondary | ICD-10-CM | POA: Insufficient documentation

## 2016-06-24 DIAGNOSIS — G4733 Obstructive sleep apnea (adult) (pediatric): Secondary | ICD-10-CM | POA: Insufficient documentation

## 2016-06-26 ENCOUNTER — Ambulatory Visit
Admission: RE | Admit: 2016-06-26 | Discharge: 2016-06-26 | Disposition: A | Discharge status: Home / Self Care | Payer: Medicare Other | Source: Ambulatory Visit | Attending: General Surgery | Admitting: General Surgery

## 2016-06-26 DIAGNOSIS — R920 Mammographic microcalcification found on diagnostic imaging of breast: Secondary | ICD-10-CM | POA: Diagnosis not present

## 2016-07-01 ENCOUNTER — Encounter: Payer: Self-pay | Admitting: *Deleted

## 2016-07-07 ENCOUNTER — Ambulatory Visit (INDEPENDENT_AMBULATORY_CARE_PROVIDER_SITE_OTHER): Payer: Medicare Other | Admitting: General Surgery

## 2016-07-07 ENCOUNTER — Encounter: Payer: Self-pay | Admitting: General Surgery

## 2016-07-07 VITALS — BP 134/72 | HR 88 | Resp 12 | Ht 63.0 in | Wt 266.0 lb

## 2016-07-07 DIAGNOSIS — R920 Mammographic microcalcification found on diagnostic imaging of breast: Secondary | ICD-10-CM | POA: Diagnosis not present

## 2016-07-07 NOTE — Progress Notes (Signed)
Patient ID: Tamara Cooper, female   DOB: 1941-06-25, 75 y.o.   MRN: [id number]  Chief Complaint  Patient presents with  . Follow-up    mammogram    HPI Tamara Cooper is a 75 y.o. female who presents for a breast follow up. The most recent mammogram was done on 06/26/16 .  Patient does perform regular self breast checks and gets regular mammograms done. Patient states she does have some ocassional right breast tenderness.   HPI  Past Medical History:  Diagnosis Date  . Arthritis   . Asthma   . Cancer ([place])    skin cancer basil cell  . COPD (chronic obstructive pulmonary disease) ([place])   . GERD (gastroesophageal reflux disease)     Past Surgical History:  Procedure Laterality Date  . ABDOMINAL HYSTERECTOMY    . basil cell excision Right    right breast  . BREAST BIOPSY Right 1997, 2006   Dystrophic calcifications without malignancy.  Marland Kitchen BREAST BIOPSY Left 2012   Dystrophic calcifications without malignancy.  . CHOLECYSTECTOMY    . KIDNEY SURGERY      Family History  Problem Relation Age of Onset  . Cancer Father   . Breast cancer Neg Hx     Social History Social History  Substance Use Topics  . Smoking status: Never Smoker  . Smokeless tobacco: Never Used  . Alcohol use No    Allergies  Allergen Reactions  . Doxycycline Nausea Only  . Eggs Or Egg-Derived Products Other (See Comments)    GI bloating  . Sulfa Antibiotics Nausea Only  . Valsartan Cough and Other (See Comments)  . Aspirin Rash    Mouth ulcers  . Diltiazem Rash  . Diltiazem Hcl Rash    Current Outpatient Prescriptions  Medication Sig Dispense Refill  . albuterol (PROAIR HFA) 108 (90 BASE) MCG/ACT inhaler TAKE 2 PUFFS 4 TIMES A DAY AS NEEDED    . Cholecalciferol (VITAMIN D-1000 MAX ST) 1000 units tablet Take by mouth.    . DULoxetine (CYMBALTA) 20 MG capsule Take 20 mg by mouth daily.    Marland Kitchen esomeprazole (NEXIUM) 40 MG capsule Take by mouth.    . folic acid (FOLVITE) 1 MG tablet 1mg  - 2  tablets by mouth once day.    . furosemide (LASIX) 20 MG tablet     . levothyroxine (SYNTHROID, LEVOTHROID) 125 MCG tablet Take by mouth.    . losartan-hydrochlorothiazide (HYZAAR) 100-25 MG per tablet TAKE ONE TABLET EVERY DAY    . montelukast (SINGULAIR) 10 MG tablet     . Multiple Vitamin (MULTI-VITAMINS) TABS Take by mouth.    . traMADol (ULTRAM) 50 MG tablet Take by mouth.     No current facility-administered medications for this visit.     Review of Systems Review of Systems  Constitutional: Negative.   Respiratory: Negative.   Cardiovascular: Negative.   Gastrointestinal: Negative.     Blood pressure 134/72, pulse 88, resp. rate 12, height 5\' 3"  (1.6 m), weight 266 lb (120.7 kg).  Physical Exam Physical Exam  Constitutional: She is oriented to person, place, and time. She appears well-developed and well-nourished.  Eyes: Conjunctivae are normal. No scleral icterus.  Neck: Neck supple.  Cardiovascular: Normal rate, normal heart sounds and intact distal pulses.   Pulmonary/Chest: Effort normal. Right breast exhibits no inverted nipple, no mass, no nipple discharge, no skin change and no tenderness. Left breast exhibits no inverted nipple, no mass, no nipple discharge, no skin change and no tenderness.  Crackles in right lung base, unchanged from last year's exam.  Lymphadenopathy:    She has no cervical adenopathy.    She has no axillary adenopathy.  Neurological: She is alert and oriented to person, place, and time.  Skin: Skin is warm and dry.    Data Reviewed Bilateral mammograms dated 06/26/2016 were reviewed. No interval change over 2 years with the multiple areas of microcalcifications. BI-RADS-2.  Assessment    Benign breast exam.    Plan   .  Annual screening mammograms with her PCP should be considered. Patient will return to her primary care doctor in one year with a bilateral screening mammogram.      This information has been scribed by Verlene Mayer, CMA    Robert Bellow 07/07/2016, 8:49 PM

## 2016-07-07 NOTE — Patient Instructions (Addendum)
Continue self breast exams. Call office for any new breast issues or concerns. 

## 2016-08-22 ENCOUNTER — Ambulatory Visit: Payer: Medicare Other | Attending: Specialist

## 2016-08-22 DIAGNOSIS — G4761 Periodic limb movement disorder: Secondary | ICD-10-CM | POA: Diagnosis not present

## 2016-08-22 DIAGNOSIS — G4733 Obstructive sleep apnea (adult) (pediatric): Secondary | ICD-10-CM | POA: Insufficient documentation

## 2016-11-16 IMAGING — MR MR ABDOMEN WO/W CM
16 of 17 series · 43 of 48 positions shown · IV contrast (multihance)
Comparison: MRI 08/15/2015

CLINICAL DATA: Indeterminate renal lesion on MRI. MRI follow-up
recommended.

EXAM:
MRI ABDOMEN WITHOUT AND WITH CONTRAST
TECHNIQUE: Multiplanar multisequence MR imaging of the abdomen was performed
both before and after the administration of intravenous contrast.
CONTRAST:  20mL MULTIHANCE GADOBENATE DIMEGLUMINE 529 MG/ML IV SOLN

[Series 2: cor ssfse / · coronal · 7.0mm · 1.64mm/px · 1 of 30 slices shown]
[im 1/30]
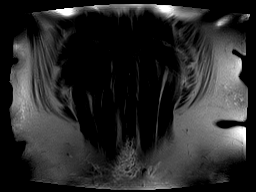

[Series 3: T2 · axial · 6.0mm · 1.56mm/px · 1 of 33 slices shown]
[im 1/33]
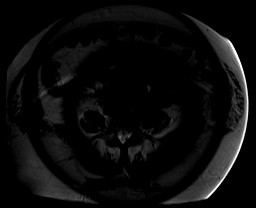

[Series 4: T1 · axial · 6.0mm · 0.78mm/px · z∈[-84,+147]mm · 3 of 66 slices shown]
[im 1/66]
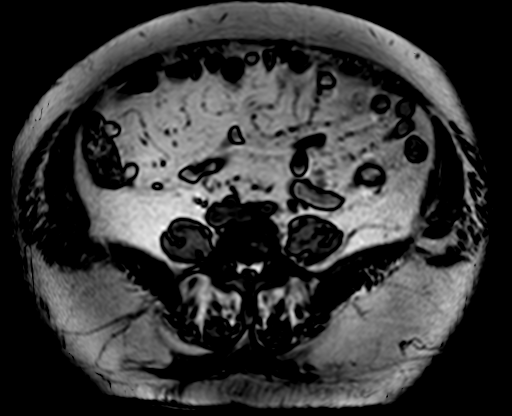
[im 33/66]
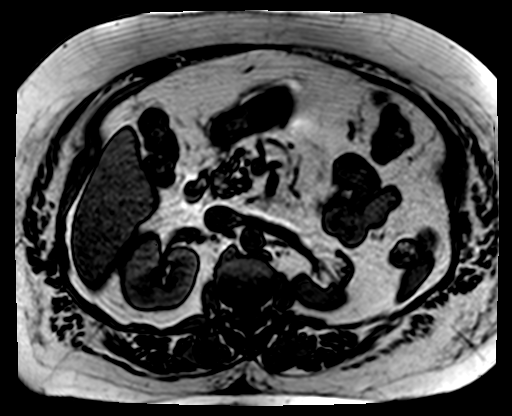
[im 66/66]
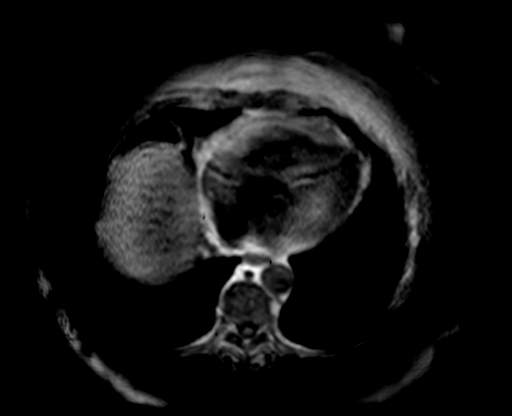

[Series 5: DWI · axial · 7.0mm · 2.08mm/px · z∈[-103,+166]mm · 5 of 99 slices shown (1 of 2)]
[im 1/99]
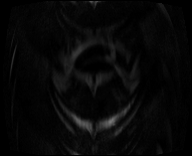
[im 25/99]
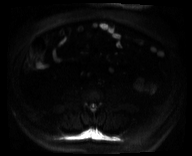
[im 50/99]
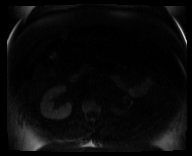
[im 74/99]
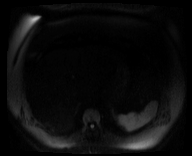
[im 99/99]
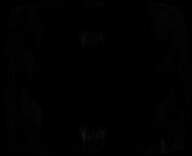

[Series 6: DWI · axial · 7.0mm · 2.08mm/px · z∈[-103,+166]mm · 2 of 33 slices shown (2 of 2)]
[im 1/33]
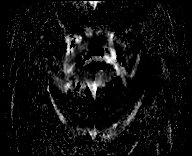
[im 33/33]
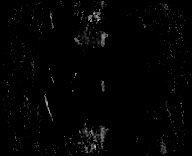

[Series 7: bSSFP · axial · 6.0mm · 0.78mm/px · z∈[-84,+147]mm · 2 of 33 slices shown]
[im 1/33]
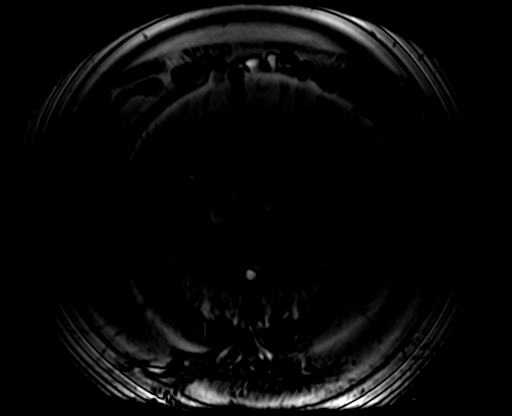
[im 33/33]
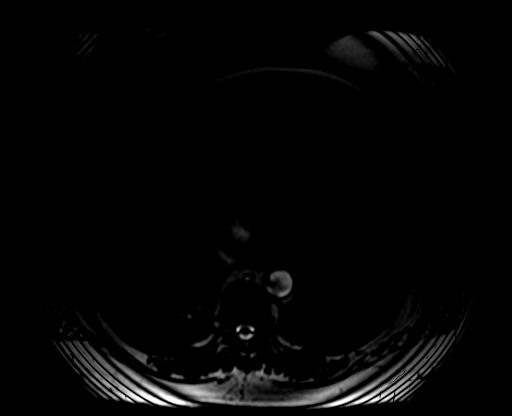

[Series 8: axial dynamic pre · axial · non-contrast · 4.0mm · 1.25mm/px · z∈[-77,+175]mm · 3 of 64 slices shown]
[im 1/64]
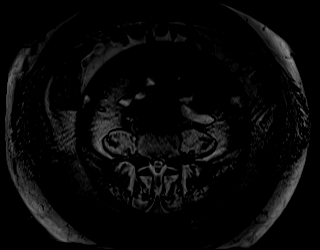
[im 32/64]
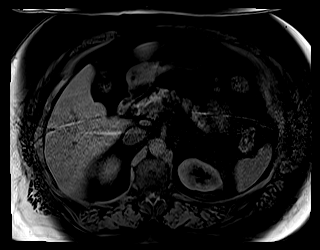
[im 64/64]
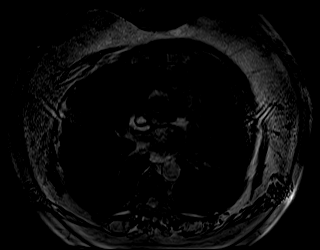

[Series 9: axial dynamic post · axial · 4.0mm · 1.25mm/px · z∈[-77,+175]mm · 3 of 64 slices shown (1 of 6)]
[im 1/64]
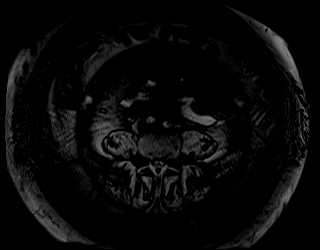
[im 32/64]
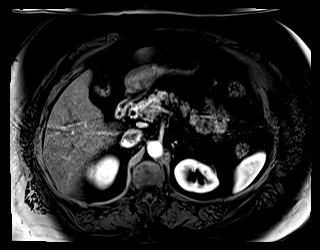
[im 64/64]
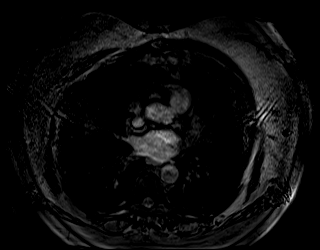

[Series 10: axial dynamic post · axial · 4.0mm · 1.25mm/px · z∈[-77,+175]mm · 3 of 64 slices shown (2 of 6)]
[im 1/64]
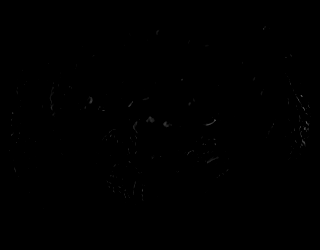
[im 32/64]
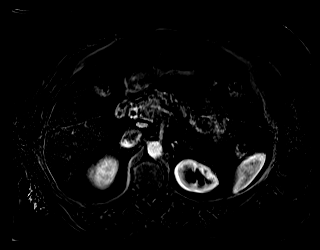
[im 64/64]
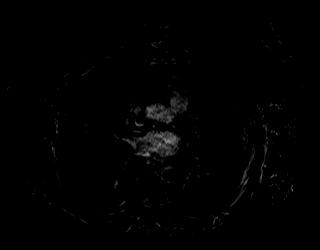

[Series 11: axial dynamic post · axial · 4.0mm · 1.25mm/px · z∈[-77,+175]mm · 3 of 64 slices shown (3 of 6)]
[im 1/64]
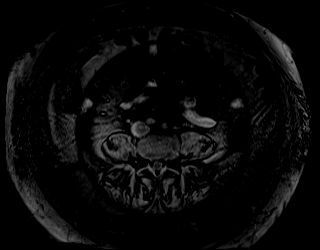
[im 32/64]
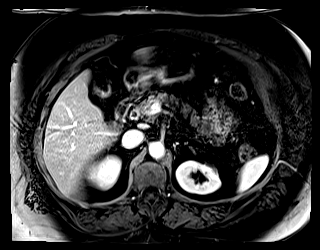
[im 64/64]
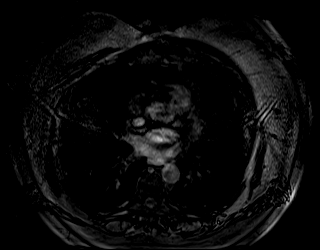

[Series 12: axial dynamic post · axial · 4.0mm · 1.25mm/px · z∈[-77,+175]mm · 3 of 64 slices shown (4 of 6)]
[im 1/64]
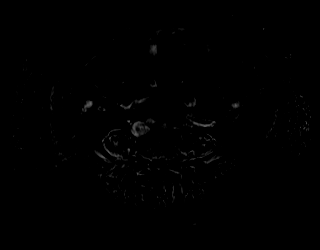
[im 32/64]
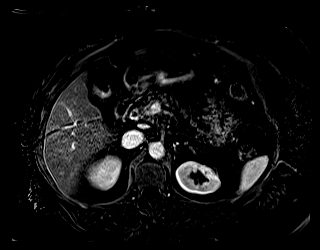
[im 64/64]
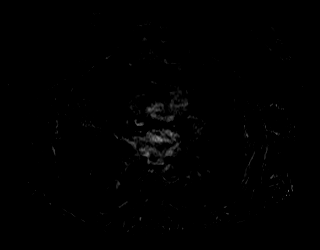

[Series 13: axial dynamic post · axial · 4.0mm · 1.25mm/px · z∈[-77,+175]mm · 3 of 64 slices shown (5 of 6)]
[im 1/64]
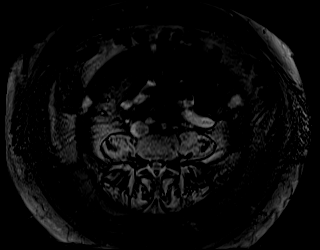
[im 32/64]
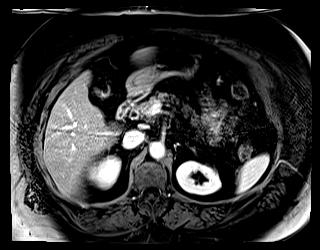
[im 64/64]
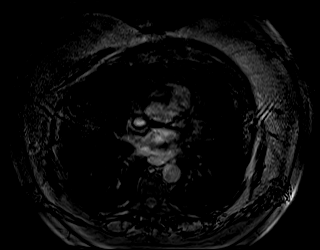

[Series 14: axial dynamic post · axial · 4.0mm · 1.25mm/px · z∈[-77,+175]mm · 3 of 64 slices shown (6 of 6)]
[im 1/64]
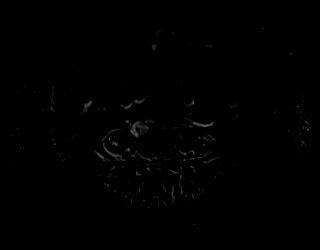
[im 32/64]
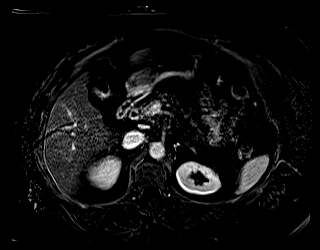
[im 64/64]
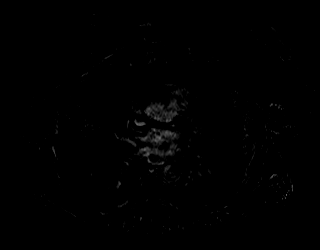

[Series 16: axial ssfse / · axial · 6.0mm · 1.25mm/px · z∈[-84,+147]mm · 2 of 33 slices shown]
[im 1/33]
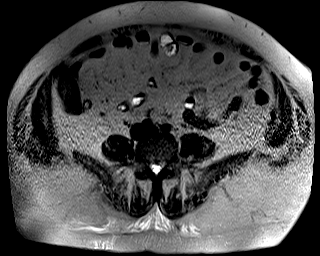
[im 33/33]
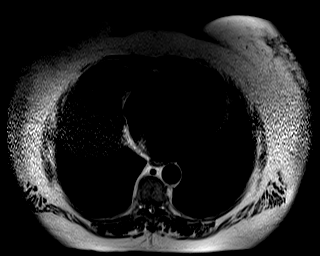

[Series 17: axial dynamic 3 · axial · 4.0mm · 1.25mm/px · z∈[-77,+175]mm · 3 of 64 slices shown (1 of 2)]
[im 1/64]
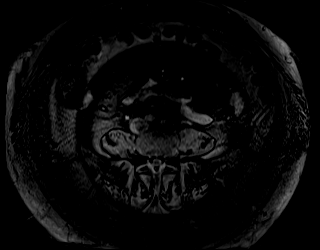
[im 32/64]
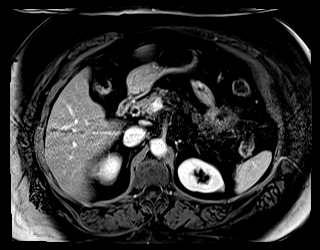
[im 64/64]
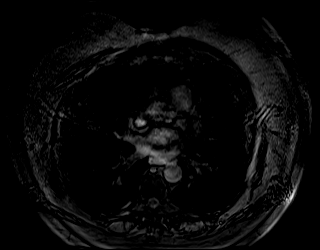

[Series 18: axial dynamic 3 · axial · 4.0mm · 1.25mm/px · z∈[-77,+175]mm · 3 of 64 slices shown (2 of 2)]
[im 1/64]
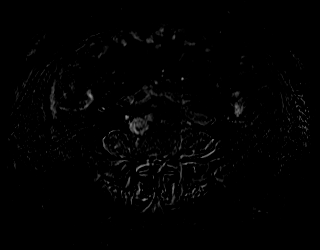
[im 32/64]
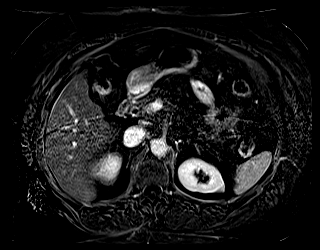
[im 64/64]
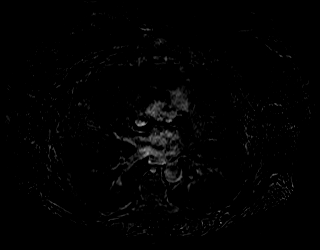

[43 of 48 positions shown; findings below may reference images not displayed]

FINDINGS: Lower chest:  Lung bases are clear.

Hepatobiliary: Small benign hepatic cysts again noted in the LEFT
hepatic lobe. No biliary duct dilatation. Patient status post
cholecystectomy. Mild hepatic steatosis again noted (series 4).

Pancreas: Normal pancreatic parenchymal intensity. No ductal
dilatation or inflammation.

Spleen: Normal spleen.

Adrenals/urinary tract: Adrenal glands are normal. There is a
segment of severe cortical atrophy in the mid to lower pole of the
LEFT kidney which is unchanged from comparison exam. Small amount of
spared parenchyma in the lower pole of the LEFT kidney is also
unchanged. Upper pole is relatively normal. No abnormal enhancement
within LEFT or RIGHT kidney.

Stomach/Bowel: Stomach and limited of the small bowel is
unremarkable

Vascular/Lymphatic: Abdominal aortic normal caliber. No
retroperitoneal periportal lymphadenopathy.

Musculoskeletal: Small hemangioma in the T12 vertebral body.
IMPRESSION: 1. No worrisome enhancing lesion the LEFT kidney. The superior
atrophy of the mid and lower pole of the LEFT kidney not changed
prior. No specific follow-up recommended.
2. Mild hepatic steatosis .
3. Cholecystectomy.

## 2016-11-20 ENCOUNTER — Other Ambulatory Visit: Payer: Self-pay | Admitting: Specialist

## 2016-11-20 DIAGNOSIS — J986 Disorders of diaphragm: Secondary | ICD-10-CM

## 2016-11-25 ENCOUNTER — Ambulatory Visit
Admission: RE | Admit: 2016-11-25 | Discharge: 2016-11-25 | Disposition: A | Discharge status: Home / Self Care | Payer: Medicare Other | Source: Ambulatory Visit | Attending: Specialist | Admitting: Specialist

## 2016-11-25 DIAGNOSIS — R05 Cough: Secondary | ICD-10-CM | POA: Diagnosis not present

## 2016-11-25 DIAGNOSIS — R0602 Shortness of breath: Secondary | ICD-10-CM | POA: Insufficient documentation

## 2016-11-25 DIAGNOSIS — J986 Disorders of diaphragm: Secondary | ICD-10-CM

## 2016-11-25 DIAGNOSIS — K219 Gastro-esophageal reflux disease without esophagitis: Secondary | ICD-10-CM | POA: Diagnosis not present

## 2017-03-17 ENCOUNTER — Other Ambulatory Visit: Payer: Self-pay | Admitting: Rheumatology

## 2017-03-17 DIAGNOSIS — R0602 Shortness of breath: Secondary | ICD-10-CM

## 2017-03-23 ENCOUNTER — Ambulatory Visit
Admission: RE | Admit: 2017-03-23 | Discharge: 2017-03-23 | Disposition: A | Discharge status: Home / Self Care | Payer: Medicare Other | Source: Ambulatory Visit | Attending: Rheumatology | Admitting: Rheumatology

## 2017-03-23 DIAGNOSIS — R918 Other nonspecific abnormal finding of lung field: Secondary | ICD-10-CM | POA: Diagnosis not present

## 2017-03-23 DIAGNOSIS — K449 Diaphragmatic hernia without obstruction or gangrene: Secondary | ICD-10-CM | POA: Diagnosis not present

## 2017-03-23 DIAGNOSIS — I700 Atherosclerosis of aorta: Secondary | ICD-10-CM | POA: Insufficient documentation

## 2017-03-23 DIAGNOSIS — I2510 Atherosclerotic heart disease of native coronary artery without angina pectoris: Secondary | ICD-10-CM | POA: Diagnosis not present

## 2017-03-23 DIAGNOSIS — R0602 Shortness of breath: Secondary | ICD-10-CM | POA: Diagnosis present

## 2017-08-16 ENCOUNTER — Encounter: Payer: Self-pay | Admitting: Emergency Medicine

## 2017-08-16 ENCOUNTER — Other Ambulatory Visit: Payer: Self-pay

## 2017-08-16 ENCOUNTER — Emergency Department: Payer: Medicare Other

## 2017-08-16 ENCOUNTER — Inpatient Hospital Stay
Admission: EM | Admit: 2017-08-16 | Discharge: 2017-08-19 | DRG: 190 | Disposition: A | Discharge status: Home Health | Payer: Medicare Other | Attending: Internal Medicine | Admitting: Internal Medicine

## 2017-08-16 DIAGNOSIS — Z809 Family history of malignant neoplasm, unspecified: Secondary | ICD-10-CM

## 2017-08-16 DIAGNOSIS — E039 Hypothyroidism, unspecified: Secondary | ICD-10-CM | POA: Diagnosis present

## 2017-08-16 DIAGNOSIS — Z888 Allergy status to other drugs, medicaments and biological substances status: Secondary | ICD-10-CM

## 2017-08-16 DIAGNOSIS — Z881 Allergy status to other antibiotic agents status: Secondary | ICD-10-CM

## 2017-08-16 DIAGNOSIS — R0902 Hypoxemia: Secondary | ICD-10-CM

## 2017-08-16 DIAGNOSIS — Z886 Allergy status to analgesic agent status: Secondary | ICD-10-CM

## 2017-08-16 DIAGNOSIS — Z85828 Personal history of other malignant neoplasm of skin: Secondary | ICD-10-CM

## 2017-08-16 DIAGNOSIS — J441 Chronic obstructive pulmonary disease with (acute) exacerbation: Principal | ICD-10-CM | POA: Diagnosis present

## 2017-08-16 DIAGNOSIS — Z882 Allergy status to sulfonamides status: Secondary | ICD-10-CM

## 2017-08-16 DIAGNOSIS — Z79890 Hormone replacement therapy (postmenopausal): Secondary | ICD-10-CM

## 2017-08-16 DIAGNOSIS — K219 Gastro-esophageal reflux disease without esophagitis: Secondary | ICD-10-CM | POA: Diagnosis present

## 2017-08-16 DIAGNOSIS — R062 Wheezing: Secondary | ICD-10-CM

## 2017-08-16 DIAGNOSIS — F329 Major depressive disorder, single episode, unspecified: Secondary | ICD-10-CM | POA: Diagnosis present

## 2017-08-16 DIAGNOSIS — M069 Rheumatoid arthritis, unspecified: Secondary | ICD-10-CM | POA: Diagnosis present

## 2017-08-16 DIAGNOSIS — M1990 Unspecified osteoarthritis, unspecified site: Secondary | ICD-10-CM | POA: Diagnosis present

## 2017-08-16 DIAGNOSIS — Z9049 Acquired absence of other specified parts of digestive tract: Secondary | ICD-10-CM

## 2017-08-16 DIAGNOSIS — I10 Essential (primary) hypertension: Secondary | ICD-10-CM | POA: Diagnosis present

## 2017-08-16 DIAGNOSIS — Z9981 Dependence on supplemental oxygen: Secondary | ICD-10-CM

## 2017-08-16 DIAGNOSIS — J9621 Acute and chronic respiratory failure with hypoxia: Secondary | ICD-10-CM | POA: Diagnosis present

## 2017-08-16 DIAGNOSIS — Z91012 Allergy to eggs: Secondary | ICD-10-CM

## 2017-08-16 DIAGNOSIS — Z90710 Acquired absence of both cervix and uterus: Secondary | ICD-10-CM

## 2017-08-16 DIAGNOSIS — J449 Chronic obstructive pulmonary disease, unspecified: Secondary | ICD-10-CM | POA: Diagnosis present

## 2017-08-16 DIAGNOSIS — J440 Chronic obstructive pulmonary disease with acute lower respiratory infection: Secondary | ICD-10-CM | POA: Diagnosis present

## 2017-08-16 DIAGNOSIS — J209 Acute bronchitis, unspecified: Secondary | ICD-10-CM | POA: Diagnosis present

## 2017-08-16 LAB — TROPONIN I: TROPONIN I: 0.03 ng/mL — AB (ref <0.03)

## 2017-08-16 LAB — BASIC METABOLIC PANEL
ANION GAP: 10 (ref 5–15)
BUN: 20 mg/dL (ref 6–20)
CALCIUM: 7.4 mg/dL — AB (ref 8.9–10.3)
CO2: 21 mmol/L — ABNORMAL LOW (ref 22–32)
CREATININE: 0.87 mg/dL (ref 0.44–1.00)
Chloride: 108 mmol/L (ref 101–111)
GFR calc Af Amer: >60 mL/min (ref >60)
GLUCOSE: 175 mg/dL — AB (ref 65–99)
Potassium: 3.3 mmol/L — ABNORMAL LOW (ref 3.5–5.1)
Sodium: 139 mmol/L (ref 135–145)

## 2017-08-16 LAB — CBC
HEMATOCRIT: 41.1 % (ref 35.0–47.0)
Hemoglobin: 13.4 g/dL (ref 12.0–16.0)
MCH: 30.6 pg (ref 26.0–34.0)
MCHC: 32.5 g/dL (ref 32.0–36.0)
MCV: 93.9 fL (ref 80.0–100.0)
PLATELETS: 235 10*3/uL (ref 150–440)
RBC: 4.37 MIL/uL (ref 3.80–5.20)
RDW: 14.9 % — AB (ref 11.5–14.5)
WBC: 16.9 10*3/uL — AB (ref 3.6–11.0)

## 2017-08-16 LAB — INFLUENZA PANEL BY PCR (TYPE A & B)

## 2017-08-16 LAB — BRAIN NATRIURETIC PEPTIDE: B Natriuretic Peptide: 122 pg/mL — ABNORMAL HIGH (ref 0.0–100.0)

## 2017-08-16 MED ORDER — IPRATROPIUM-ALBUTEROL 0.5-2.5 (3) MG/3ML IN SOLN
3.0000 mL | Freq: Once | RESPIRATORY_TRACT | Status: AC
  Administered 2017-08-16: 3 mL via RESPIRATORY_TRACT
  Filled 2017-08-16: qty 3

## 2017-08-16 MED ORDER — ALBUTEROL SULFATE (2.5 MG/3ML) 0.083% IN NEBU
5.0000 mg | INHALATION_SOLUTION | Freq: Once | RESPIRATORY_TRACT | Status: AC
  Administered 2017-08-16: 5 mg via RESPIRATORY_TRACT
  Filled 2017-08-16: qty 6

## 2017-08-16 MED ORDER — IPRATROPIUM-ALBUTEROL 0.5-2.5 (3) MG/3ML IN SOLN
3.0000 mL | Freq: Once | RESPIRATORY_TRACT | Status: DC
  Filled 2017-08-16: qty 3

## 2017-08-16 MED ORDER — METHYLPREDNISOLONE SODIUM SUCC 125 MG IJ SOLR
125.0000 mg | Freq: Once | INTRAMUSCULAR | Status: AC
Start: 2017-08-16 — End: 2017-08-16
  Administered 2017-08-16: 125 mg via INTRAVENOUS
  Filled 2017-08-16: qty 2

## 2017-08-16 MED ORDER — CLONIDINE HCL 0.1 MG PO TABS
0.1000 mg | ORAL_TABLET | Freq: Once | ORAL | Status: AC
  Administered 2017-08-16: 0.1 mg via ORAL
  Filled 2017-08-16: qty 1

## 2017-08-16 NOTE — ED Provider Notes (Signed)
[facility] Emergency Department Provider Note ____________________________________________   First MD Initiated Contact with Patient 08/16/17 1958     (approximate)  I have reviewed the triage vital signs and the nursing notes.   HISTORY  Chief Complaint COPD    HPI Tamara Cooper is a 77 y.o. female with past history of COPD and other PMH as noted below who presents with shortness of breath, gradual onset over the last several days, not relieved by inhalers at home, and not associated with fever, productive cough, or chest pain.  Patient reports some pain under her right breast.  She states that the symptoms feel similar to prior COPD exacerbations.  Past Medical History:  Diagnosis Date  . Arthritis   . Asthma   . Cancer ([place])    skin cancer basil cell  . COPD (chronic obstructive pulmonary disease) ([place])   . GERD (gastroesophageal reflux disease)     Patient Active Problem List   Diagnosis Date Noted  . Renal mass 08/29/2015  . Urinary urgency 08/29/2015  . Encounter for general adult medical examination without abnormal findings 03/23/2015  . Rheumatoid arthritis involving multiple joints ([place]) 12/26/2014  . Breast microcalcification, mammographic 08/14/2014  . Major depression in remission ([place]) 05/26/2014  . Other long term (current) drug therapy 12/01/2013  . Acquired hypothyroidism 11/19/2013  . Arthritis 11/19/2013  . Airway hyperreactivity 11/19/2013  . Acid reflux 11/19/2013  . Morbid (severe) obesity due to excess calories ([place]) 11/19/2013  . Arthritis, degenerative 11/19/2013  . Seropositive rheumatoid arthritis ([place]) 11/19/2013  . Apnea, sleep 11/19/2013  . Disease of thyroid gland 11/19/2013    Past Surgical History:  Procedure Laterality Date  . ABDOMINAL HYSTERECTOMY    . basil cell excision Right    right breast  . BREAST BIOPSY Right 1997, 2006   Dystrophic calcifications without malignancy.  Marland Kitchen BREAST BIOPSY Left  2012   Dystrophic calcifications without malignancy.  . CHOLECYSTECTOMY    . KIDNEY SURGERY      Prior to Admission medications   Medication Sig Start Date End Date Taking? Authorizing Provider  albuterol (PROAIR HFA) 108 (90 BASE) MCG/ACT inhaler TAKE 2 PUFFS 4 TIMES A DAY AS NEEDED 06/13/14  Yes [provider]  Cholecalciferol (VITAMIN D-1000 MAX ST) 1000 units tablet Take 1,000 Units by mouth daily.    Yes [provider]  DULoxetine (CYMBALTA) 20 MG capsule Take 20 mg by mouth daily.   Yes [provider]  esomeprazole (NEXIUM) 40 MG capsule Take 40 mg by mouth as needed.    Yes [provider]  folic acid (FOLVITE) 1 MG tablet Take 1 mg by mouth daily. 1mg  - 2 tablets by mouth once day. 01/10/14  Yes [provider]  leflunomide (ARAVA) 10 MG tablet Take 10 mg by mouth daily. 06/16/17  Yes [provider]  levothyroxine (SYNTHROID, LEVOTHROID) 125 MCG tablet Take 125 mcg by mouth daily before breakfast.    Yes [provider]  losartan-hydrochlorothiazide (HYZAAR) 100-25 MG per tablet TAKE ONE TABLET EVERY DAY 03/13/14  Yes [provider]  montelukast (SINGULAIR) 10 MG tablet Take 10 mg by mouth at bedtime.  08/03/15  Yes [provider]  Multiple Vitamin (MULTI-VITAMINS) TABS Take 1 tablet by mouth daily.    Yes [provider]  traMADol (ULTRAM) 50 MG tablet Take 50 mg by mouth as needed.  08/01/14  Yes [provider]  furosemide (LASIX) 20 MG tablet  01/23/14   [provider]    Allergies Doxycycline; Eggs or egg-derived products; Sulfa antibiotics; Valsartan; Aspirin; Diltiazem; and Diltiazem hcl  Family History  Problem Relation Age of Onset  . Cancer Father   . Breast cancer Neg Hx     Social History Social History   Tobacco Use  . Smoking status: Never Smoker  . Smokeless tobacco: Never Used  Substance Use Topics  . Alcohol use: No    Alcohol/week: 0.0 oz  .  Drug use: No    Review of Systems  Constitutional: No fever. Eyes: No redness. ENT: No sore throat. Cardiovascular: No chest pain. Respiratory: Positive for shortness of breath. Gastrointestinal: No nausea, no vomiting.  Genitourinary: Negative for flank pain.  Musculoskeletal: Negative for back pain. Skin: Negative for rash. Neurological: Negative for headache.   ____________________________________________   PHYSICAL EXAM:  VITAL SIGNS: ED Triage Vitals  Enc Vitals Group     BP 08/16/17 1948 (!) 147/132     Pulse Rate 08/16/17 1948 (!) 102     Resp 08/16/17 1948 (!) 24     Temp 08/16/17 1948 97.8 F (36.6 C)     Temp Source 08/16/17 1948 Oral     SpO2 08/16/17 1947 94 %     Weight 08/16/17 1950 264 lb (119.7 kg)     Height 08/16/17 1950 5\' 3"  (1.6 m)     Head Circumference --      Peak Flow --      Pain Score 08/16/17 1947 0     Pain Loc --      Pain Edu? --      Excl. in [place]? --     Constitutional: Alert and oriented.  Uncomfortable appearing but in no significant respiratory distress. Eyes: Conjunctivae are normal.  EOMI. Head: Atraumatic. Nose: No congestion/rhinnorhea. Mouth/Throat: Mucous membranes are somewhat dry.   Neck: Normal range of motion.  Cardiovascular: Normal rate, regular rhythm. Grossly normal heart sounds.  Good peripheral circulation. Respiratory: Slightly increased respiratory effort.  No retractions.  Bilateral mild wheeze and decreased breath sounds. Gastrointestinal: Soft and nontender. No distention.  Genitourinary: No flank tenderness. Musculoskeletal: No lower extremity edema.  Extremities warm and well perfused.  Neurologic:  Normal speech and language. No gross focal neurologic deficits are appreciated.  Skin:  Skin is warm and dry. No rash noted. Psychiatric: Mood and affect are normal. Speech and behavior are normal.  ____________________________________________   LABS (all labs ordered are listed, but only abnormal results  are displayed)  Labs Reviewed  CBC - Abnormal; Notable for the following components:      Result Value   WBC 16.9 (*)    RDW 14.9 (*)    All other components within normal limits  TROPONIN I - Abnormal; Notable for the following components:   Troponin I 0.03 (*)    All other components within normal limits  BASIC METABOLIC PANEL - Abnormal; Notable for the following components:   Potassium 3.3 (*)    CO2 21 (*)    Glucose, Bld 175 (*)    Calcium 7.4 (*)    All other components within normal limits  INFLUENZA PANEL BY PCR (TYPE A & B)  BRAIN NATRIURETIC PEPTIDE  URINALYSIS, COMPLETE (UACMP) WITH MICROSCOPIC   ____________________________________________  EKG  ED ECG REPORT I, Arta Silence, the attending physician, personally viewed and interpreted this ECG.  Date: 08/16/2017 EKG Time: 1946 Rate: 101 Rhythm: Sinus tachycardia QRS Axis: normal Intervals: normal ST/T Wave abnormalities: Nonspecific abnormalities anteriorly Narrative Interpretation: no evidence  of acute ischemia; no recent prior EKG available for comparison  ____________________________________________  RADIOLOGY  CXR: No focal infiltrate  ____________________________________________   PROCEDURES  Procedure(s) performed: No    Critical Care performed: No ____________________________________________   INITIAL IMPRESSION / ASSESSMENT AND PLAN / ED COURSE  Pertinent labs & imaging results that were available during my care of the patient were reviewed by me and considered in my medical decision making (see chart for details).  77 year old female with past medical history of COPD and other PMH as noted above presents with relatively acute onset of shortness of breath over the last several days, similar to prior COPD exacerbations.  Past medical records reviewed in Epic; the patient has had no recent admissions to the hospital for COPD.  On exam, the patient is uncomfortable appearing but  not in acute respiratory distress although she is somewhat tachypneic.  The other vital signs are stable, and the patient is maintaining oxygen saturation in the high 90s on her nebulizer treatment.  Lungs with decreased breath sounds and some rhonchi bilaterally.  Presentation is most consistent overall with COPD, however also consider pneumonia, bronchitis, influenza or other viral infection, or less likely cardiac cause.  No prior history of CHF.  Plan for chest x-ray, lab workup, nebs and steroid, and reassess.  ----------------------------------------- 10:57 PM on 08/16/2017 -----------------------------------------  Patient significantly improved after nebs, although her O2 sat remains in the low to mid 90s on nasal cannula, and she is normally not O2 dependent.  She appears much more comfortable.  She did have an episode of significant hypertension as high as 259 systolic, however this has improved with clonidine.  Given the patient's age and comorbidities and the acuity of the symptoms as well as the relative hypoxia, I will admit.  The patient was signed out to the hospitalist Dr. Duane Boston. ____________________________________________   FINAL CLINICAL IMPRESSION(S) / ED DIAGNOSES  Final diagnoses:  COPD with acute exacerbation ([place])  Hypertension, unspecified type      NEW MEDICATIONS STARTED DURING THIS VISIT:  This SmartLink is deprecated. Use AVSMEDLIST instead to display the medication list for a patient.   Note:  This document was prepared using Dragon voice recognition software and may include unintentional dictation errors.    Arta Silence, MD 08/16/17 2258

## 2017-08-16 NOTE — ED Notes (Signed)
Pt states unable to void at this time. 

## 2017-08-16 NOTE — ED Notes (Signed)
Per MD Siadecki, no further flu swab is needed at this time.

## 2017-08-16 NOTE — ED Triage Notes (Signed)
Pt arrives via [facility] from home with COPD exacerbation. Per EMS, pt called out for respiratory distress and was having audible wheezing and difficulty speaking in complete sentences. EMS reports VS of 91%RA and 96-97% during x 2 duonebs. Pt able to talk in full sentences at this time but is still working to breath. Pt is 94% RA for this RN and 2L O2 by nasal canula has been applied at this time raising pt to 96%. Pt does report 3L O2 at night but typically is on RA throughout the day.

## 2017-08-17 ENCOUNTER — Inpatient Hospital Stay: Payer: Medicare Other

## 2017-08-17 ENCOUNTER — Other Ambulatory Visit: Payer: Self-pay

## 2017-08-17 DIAGNOSIS — Z91012 Allergy to eggs: Secondary | ICD-10-CM | POA: Diagnosis not present

## 2017-08-17 DIAGNOSIS — E039 Hypothyroidism, unspecified: Secondary | ICD-10-CM | POA: Diagnosis present

## 2017-08-17 DIAGNOSIS — J9621 Acute and chronic respiratory failure with hypoxia: Secondary | ICD-10-CM | POA: Diagnosis present

## 2017-08-17 DIAGNOSIS — F329 Major depressive disorder, single episode, unspecified: Secondary | ICD-10-CM | POA: Diagnosis present

## 2017-08-17 DIAGNOSIS — Z79890 Hormone replacement therapy (postmenopausal): Secondary | ICD-10-CM | POA: Diagnosis not present

## 2017-08-17 DIAGNOSIS — Z881 Allergy status to other antibiotic agents status: Secondary | ICD-10-CM | POA: Diagnosis not present

## 2017-08-17 DIAGNOSIS — Z886 Allergy status to analgesic agent status: Secondary | ICD-10-CM | POA: Diagnosis not present

## 2017-08-17 DIAGNOSIS — K219 Gastro-esophageal reflux disease without esophagitis: Secondary | ICD-10-CM | POA: Diagnosis present

## 2017-08-17 DIAGNOSIS — Z882 Allergy status to sulfonamides status: Secondary | ICD-10-CM | POA: Diagnosis not present

## 2017-08-17 DIAGNOSIS — M1990 Unspecified osteoarthritis, unspecified site: Secondary | ICD-10-CM | POA: Diagnosis present

## 2017-08-17 DIAGNOSIS — Z90710 Acquired absence of both cervix and uterus: Secondary | ICD-10-CM | POA: Diagnosis not present

## 2017-08-17 DIAGNOSIS — Z888 Allergy status to other drugs, medicaments and biological substances status: Secondary | ICD-10-CM | POA: Diagnosis not present

## 2017-08-17 DIAGNOSIS — J209 Acute bronchitis, unspecified: Secondary | ICD-10-CM | POA: Diagnosis present

## 2017-08-17 DIAGNOSIS — R0902 Hypoxemia: Secondary | ICD-10-CM

## 2017-08-17 DIAGNOSIS — J441 Chronic obstructive pulmonary disease with (acute) exacerbation: Secondary | ICD-10-CM | POA: Diagnosis present

## 2017-08-17 DIAGNOSIS — I10 Essential (primary) hypertension: Secondary | ICD-10-CM | POA: Diagnosis present

## 2017-08-17 DIAGNOSIS — J449 Chronic obstructive pulmonary disease, unspecified: Secondary | ICD-10-CM | POA: Diagnosis present

## 2017-08-17 DIAGNOSIS — Z9049 Acquired absence of other specified parts of digestive tract: Secondary | ICD-10-CM | POA: Diagnosis not present

## 2017-08-17 DIAGNOSIS — Z9981 Dependence on supplemental oxygen: Secondary | ICD-10-CM | POA: Diagnosis not present

## 2017-08-17 DIAGNOSIS — Z85828 Personal history of other malignant neoplasm of skin: Secondary | ICD-10-CM | POA: Diagnosis not present

## 2017-08-17 DIAGNOSIS — J440 Chronic obstructive pulmonary disease with acute lower respiratory infection: Secondary | ICD-10-CM | POA: Diagnosis present

## 2017-08-17 DIAGNOSIS — M069 Rheumatoid arthritis, unspecified: Secondary | ICD-10-CM | POA: Diagnosis present

## 2017-08-17 DIAGNOSIS — Z809 Family history of malignant neoplasm, unspecified: Secondary | ICD-10-CM | POA: Diagnosis not present

## 2017-08-17 LAB — BASIC METABOLIC PANEL
Anion gap: 10 (ref 5–15)
BUN: 20 mg/dL (ref 6–20)
CO2: 25 mmol/L (ref 22–32)
CREATININE: 1.04 mg/dL — AB (ref 0.44–1.00)
Calcium: 8.6 mg/dL — ABNORMAL LOW (ref 8.9–10.3)
Chloride: 102 mmol/L (ref 101–111)
GFR calc Af Amer: 58 mL/min — ABNORMAL LOW (ref >60)
GFR, EST NON AFRICAN AMERICAN: 50 mL/min — AB (ref >60)
GLUCOSE: 280 mg/dL — AB (ref 65–99)
Potassium: 3.4 mmol/L — ABNORMAL LOW (ref 3.5–5.1)
SODIUM: 137 mmol/L (ref 135–145)

## 2017-08-17 LAB — CBC
HCT: 38.5 % (ref 35.0–47.0)
Hemoglobin: 12.5 g/dL (ref 12.0–16.0)
MCH: 30 pg (ref 26.0–34.0)
MCHC: 32.4 g/dL (ref 32.0–36.0)
MCV: 92.6 fL (ref 80.0–100.0)
PLATELETS: 224 10*3/uL (ref 150–440)
RBC: 4.15 MIL/uL (ref 3.80–5.20)
RDW: 14.6 % — AB (ref 11.5–14.5)
WBC: 13.8 10*3/uL — AB (ref 3.6–11.0)

## 2017-08-17 LAB — URINALYSIS, COMPLETE (UACMP) WITH MICROSCOPIC
BILIRUBIN URINE: NEGATIVE
Bacteria, UA: NONE SEEN
Glucose, UA: NEGATIVE mg/dL
Hgb urine dipstick: NEGATIVE
KETONES UR: NEGATIVE mg/dL
Leukocytes, UA: NEGATIVE
Nitrite: NEGATIVE
PH: 5 (ref 5.0–8.0)
Protein, ur: NEGATIVE mg/dL
SPECIFIC GRAVITY, URINE: 1.014 (ref 1.005–1.030)

## 2017-08-17 LAB — INFLUENZA PANEL BY PCR (TYPE A & B)
INFLBPCR: NEGATIVE
Influenza A By PCR: NEGATIVE

## 2017-08-17 LAB — GLUCOSE, CAPILLARY: GLUCOSE-CAPILLARY: 164 mg/dL — AB (ref 65–99)

## 2017-08-17 MED ORDER — FOLIC ACID 1 MG PO TABS
1.0000 mg | ORAL_TABLET | Freq: Every day | ORAL | Status: DC
  Administered 2017-08-17 – 2017-08-19 (×2): 1 mg via ORAL
  Filled 2017-08-17 (×3): qty 1

## 2017-08-17 MED ORDER — BISACODYL 5 MG PO TBEC: 5.0000 mg | DELAYED_RELEASE_TABLET | Freq: Every day | ORAL | Status: DC | PRN

## 2017-08-17 MED ORDER — LOSARTAN POTASSIUM-HCTZ 100-25 MG PO TABS: 1.0000 | ORAL_TABLET | Freq: Every day | ORAL | Status: DC

## 2017-08-17 MED ORDER — LEVOTHYROXINE SODIUM 25 MCG PO TABS
125.0000 ug | ORAL_TABLET | Freq: Every day | ORAL | Status: DC
  Filled 2017-08-17: qty 1

## 2017-08-17 MED ORDER — TRAZODONE HCL 50 MG PO TABS
25.0000 mg | ORAL_TABLET | Freq: Every evening | ORAL | Status: DC | PRN
  Filled 2017-08-17: qty 1

## 2017-08-17 MED ORDER — DULOXETINE HCL 20 MG PO CPEP
20.0000 mg | ORAL_CAPSULE | Freq: Every day | ORAL | Status: DC
  Administered 2017-08-17 – 2017-08-19 (×3): 20 mg via ORAL
  Filled 2017-08-17 (×3): qty 1

## 2017-08-17 MED ORDER — PANTOPRAZOLE SODIUM 40 MG PO TBEC
40.0000 mg | DELAYED_RELEASE_TABLET | Freq: Every day | ORAL | Status: DC
  Administered 2017-08-17 – 2017-08-19 (×3): 40 mg via ORAL
  Filled 2017-08-17 (×3): qty 1

## 2017-08-17 MED ORDER — ONDANSETRON HCL 4 MG/2ML IJ SOLN: 4.0000 mg | Freq: Four times a day (QID) | INTRAMUSCULAR | Status: DC | PRN

## 2017-08-17 MED ORDER — MONTELUKAST SODIUM 10 MG PO TABS
10.0000 mg | ORAL_TABLET | Freq: Every day | ORAL | Status: DC
  Administered 2017-08-17 – 2017-08-18 (×3): 10 mg via ORAL
  Filled 2017-08-17 (×3): qty 1

## 2017-08-17 MED ORDER — ADULT MULTIVITAMIN W/MINERALS CH
1.0000 | ORAL_TABLET | Freq: Every day | ORAL | Status: DC
  Administered 2017-08-17 – 2017-08-19 (×3): 1 via ORAL
  Filled 2017-08-17 (×4): qty 1

## 2017-08-17 MED ORDER — FUROSEMIDE 20 MG PO TABS
20.0000 mg | ORAL_TABLET | Freq: Every day | ORAL | Status: DC
  Administered 2017-08-17 – 2017-08-19 (×3): 20 mg via ORAL
  Filled 2017-08-17 (×3): qty 1

## 2017-08-17 MED ORDER — HYDROCHLOROTHIAZIDE 25 MG PO TABS
25.0000 mg | ORAL_TABLET | Freq: Every day | ORAL | Status: DC
  Administered 2017-08-17 – 2017-08-19 (×3): 25 mg via ORAL
  Filled 2017-08-17 (×3): qty 1

## 2017-08-17 MED ORDER — ORAL CARE MOUTH RINSE
15.0000 mL | Freq: Two times a day (BID) | OROMUCOSAL | Status: DC
  Administered 2017-08-17 – 2017-08-19 (×3): 15 mL via OROMUCOSAL

## 2017-08-17 MED ORDER — DOCUSATE SODIUM 100 MG PO CAPS
100.0000 mg | ORAL_CAPSULE | Freq: Two times a day (BID) | ORAL | Status: DC
  Administered 2017-08-17: 100 mg via ORAL
  Filled 2017-08-17 (×6): qty 1

## 2017-08-17 MED ORDER — LOSARTAN POTASSIUM 50 MG PO TABS
100.0000 mg | ORAL_TABLET | Freq: Every day | ORAL | Status: DC
  Administered 2017-08-17 – 2017-08-19 (×3): 100 mg via ORAL
  Filled 2017-08-17 (×3): qty 2

## 2017-08-17 MED ORDER — LEVOTHYROXINE SODIUM 25 MCG PO TABS
125.0000 ug | ORAL_TABLET | Freq: Every day | ORAL | Status: DC
  Administered 2017-08-17 – 2017-08-18 (×2): 125 ug via ORAL
  Filled 2017-08-17 (×2): qty 1

## 2017-08-17 MED ORDER — METHYLPREDNISOLONE SODIUM SUCC 125 MG IJ SOLR
60.0000 mg | Freq: Two times a day (BID) | INTRAMUSCULAR | Status: DC
  Administered 2017-08-17 – 2017-08-18 (×2): 60 mg via INTRAVENOUS
  Filled 2017-08-17 (×3): qty 2

## 2017-08-17 MED ORDER — ACETAMINOPHEN 325 MG PO TABS: 650.0000 mg | ORAL_TABLET | Freq: Four times a day (QID) | ORAL | Status: DC | PRN

## 2017-08-17 MED ORDER — POTASSIUM CHLORIDE CRYS ER 20 MEQ PO TBCR
20.0000 meq | EXTENDED_RELEASE_TABLET | Freq: Every day | ORAL | Status: DC
  Administered 2017-08-17 – 2017-08-19 (×3): 20 meq via ORAL
  Filled 2017-08-17 (×3): qty 1

## 2017-08-17 MED ORDER — CEFTRIAXONE SODIUM 1 G IJ SOLR
1.0000 g | INTRAMUSCULAR | Status: DC
  Administered 2017-08-18 – 2017-08-19 (×2): 1 g via INTRAVENOUS
  Filled 2017-08-17 (×4): qty 10

## 2017-08-17 MED ORDER — HYDROCODONE-ACETAMINOPHEN 5-325 MG PO TABS: 1.0000 | ORAL_TABLET | ORAL | Status: DC | PRN

## 2017-08-17 MED ORDER — HEPARIN SODIUM (PORCINE) 5000 UNIT/ML IJ SOLN
5000.0000 [IU] | Freq: Three times a day (TID) | INTRAMUSCULAR | Status: DC
  Administered 2017-08-17 – 2017-08-19 (×7): 5000 [IU] via SUBCUTANEOUS
  Filled 2017-08-17 (×8): qty 1

## 2017-08-17 MED ORDER — ONDANSETRON HCL 4 MG PO TABS: 4.0000 mg | ORAL_TABLET | Freq: Four times a day (QID) | ORAL | Status: DC | PRN

## 2017-08-17 MED ORDER — ACETAMINOPHEN 650 MG RE SUPP
650.0000 mg | Freq: Four times a day (QID) | RECTAL | Status: DC | PRN
Start: 2017-08-17 — End: 2017-08-19

## 2017-08-17 MED ORDER — TRAMADOL HCL 50 MG PO TABS: 50.0000 mg | ORAL_TABLET | Freq: Four times a day (QID) | ORAL | Status: DC | PRN

## 2017-08-17 MED ORDER — DEXTROSE 5 % IV SOLN
500.0000 mg | INTRAVENOUS | Status: DC
  Administered 2017-08-17 – 2017-08-19 (×3): 500 mg via INTRAVENOUS
  Filled 2017-08-17 (×4): qty 500

## 2017-08-17 MED ORDER — IPRATROPIUM-ALBUTEROL 0.5-2.5 (3) MG/3ML IN SOLN
3.0000 mL | Freq: Four times a day (QID) | RESPIRATORY_TRACT | Status: DC
  Administered 2017-08-17 – 2017-08-19 (×9): 3 mL via RESPIRATORY_TRACT
  Filled 2017-08-17 (×8): qty 3

## 2017-08-17 MED ORDER — LEFLUNOMIDE 20 MG PO TABS
10.0000 mg | ORAL_TABLET | Freq: Every day | ORAL | Status: DC
  Administered 2017-08-17 – 2017-08-19 (×3): 10 mg via ORAL
  Filled 2017-08-17 (×3): qty 1

## 2017-08-17 NOTE — Progress Notes (Addendum)
Inpatient Diabetes Program Recommendations  AACE/ADA: New Consensus Statement on Inpatient Glycemic Control (2015)  Target Ranges:  Prepandial:   less than 140 mg/dL      Peak postprandial:   less than 180 mg/dL (1-2 hours)      Critically ill patients:  140 - 180 mg/dL   Results for DEARA, BOBER (MRN [id number]) as of 08/17/2017 12:28  Ref. Range 08/16/2017 21:14 08/17/2017 03:36  Glucose Latest Ref Range: 65 - 99 mg/dL 175 (H) 280 (H)    Admit with: COPD  NO History of DM noted.      MD- Note patient receiving Solumedrol 60 mg BID.    Lab glucose elevated to 280 mg/dl this AM.  NO history of DM noted.  Please consider placing orders for Novolog Sensitive Correction Scale/ SSI (0-9 units) TID AC + HS while patient getting steroids.       --Will follow patient during hospitalization--  Wyn Quaker RN, MSN, CDE Diabetes Coordinator Inpatient Glycemic Control Team Team Pager: [phone] (8a-5p)

## 2017-08-17 NOTE — ED Notes (Signed)
Pt transported to room 149 

## 2017-08-17 NOTE — Progress Notes (Signed)
Admitted this morning for COPD exacerbation, acute bronchitis, found to have severely elevated blood pressure. Physical examination: Alert, awake, oriented does have shortness of breath with  exertion he feels little better than when she came.  Less wheezing. Cardiovascular system S1, S2 regular Lungs: Patient has faint expiratory wheeze in all lung fields.  Abdomen ;soft, nontender, nondistended.    Assessment and plan: Acute hypoxic respiratory failure secondary to COPD exacerbation, acute bronchitis.  Patient on 2 L of oxygen.  Patient has nocturnal oxygen 3 L at home.  COPD exacerbation: Continue bronchodilators, IV steroids, IV antibiotics.  depression: Continue Cymbalta. 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 daily  History of rheumatoid arthritis: Follows up with Dr. Roberts Gaudy.  Continue Arava, folic acid. Hypothyroidism continue Synthyroid. Time spent 30 minutes.

## 2017-08-17 NOTE — H&P (Signed)
[place] at [place] NAME: Tamara Cooper    MR#:  [id number]  DATE OF BIRTH:  [birth year]  DATE OF ADMISSION:  08/16/2017  PRIMARY CARE PHYSICIAN: Kirk Ruths, MD   REQUESTING/REFERRING PHYSICIAN:   CHIEF COMPLAINT:   Chief Complaint  Patient presents with  . COPD    HISTORY OF PRESENT ILLNESS: Tamara Cooper  is a 77 y.o. female with a known history of COPD on 3 L oxygen per nasal cannula at home at night. Patient was brought to emergency room for acute onset of severe shortness of breath and wheezing started suddenly in the past 24 hours.  Patient also complains of right-sided chest pain under the right breast.  She also has occasional dry cough.  She denies any fever or chills.  No nausea vomiting abdominal pain diarrhea or bleeding.  Her symptoms are worse with exertion and improve with nebulizer treatment. Upon evaluation, in emergency room she was noted with tachypnea, tachycardia and hypoxemia.  Oxygen saturation was 90% on room air.  WBC is elevated at 16.9.  Chest x-ray shows acute bronchitis changes but no infiltrate.  UA is pending.  Her blood pressure was elevated at the arrival to emergency room, SBP at 200, but improved after clonidine.  Patient is admitted for further evaluation and treatment.  PAST MEDICAL HISTORY:   Past Medical History:  Diagnosis Date  . Arthritis   . Asthma   . Cancer ([place])    skin cancer basil cell  . COPD (chronic obstructive pulmonary disease) ([place])   . GERD (gastroesophageal reflux disease)     PAST SURGICAL HISTORY:  Past Surgical History:  Procedure Laterality Date  . ABDOMINAL HYSTERECTOMY    . basil cell excision Right    right breast  . BREAST BIOPSY Right 1997, 2006   Dystrophic calcifications without malignancy.  Marland Kitchen BREAST BIOPSY Left 2012   Dystrophic calcifications without malignancy.  . CHOLECYSTECTOMY    . KIDNEY SURGERY      SOCIAL HISTORY:  Social History    Tobacco Use  . Smoking status: Never Smoker  . Smokeless tobacco: Never Used  Substance Use Topics  . Alcohol use: No    Alcohol/week: 0.0 oz    FAMILY HISTORY:  Family History  Problem Relation Age of Onset  . Cancer Father   . Breast cancer Neg Hx     DRUG ALLERGIES:  Allergies  Allergen Reactions  . Doxycycline Nausea Only  . Eggs Or Egg-Derived Products Other (See Comments)    GI bloating  . Sulfa Antibiotics Nausea Only  . Valsartan Cough and Other (See Comments)  . Aspirin Rash    Mouth ulcers  . Diltiazem Rash  . Diltiazem Hcl Rash    REVIEW OF SYSTEMS:   CONSTITUTIONAL: No fever. Pt c/o fatigue and generalized weakness.  EYES: No blurred or double vision.  EARS, NOSE, AND THROAT: No tinnitus or ear pain.  RESPIRATORY: Pt c/o cough, shortness of breath, wheezing; no hemoptysis.  CARDIOVASCULAR: No chest pain, orthopnea, edema.  GASTROINTESTINAL: No nausea, vomiting, diarrhea or abdominal pain.  GENITOURINARY: No dysuria, hematuria.  ENDOCRINE: No polyuria, nocturia,  HEMATOLOGY: No anemia, easy bruising or bleeding SKIN: No rash or lesion. MUSCULOSKELETAL: History of RA.   NEUROLOGIC: No focal weakness.  PSYCHIATRY: No anxiety or depression.   MEDICATIONS AT HOME:  Prior to Admission medications   Medication Sig Start Date End Date Taking? Authorizing Provider  albuterol (PROAIR HFA) 108 (90 BASE)  MCG/ACT inhaler TAKE 2 PUFFS 4 TIMES A DAY AS NEEDED 06/13/14  Yes [provider]  Cholecalciferol (VITAMIN D-1000 MAX ST) 1000 units tablet Take 1,000 Units by mouth daily.    Yes [provider]  DULoxetine (CYMBALTA) 20 MG capsule Take 20 mg by mouth daily.   Yes [provider]  esomeprazole (NEXIUM) 40 MG capsule Take 40 mg by mouth as needed.    Yes [provider]  folic acid (FOLVITE) 1 MG tablet Take 1 mg by mouth daily. 1mg  - 2 tablets by mouth once day. 01/10/14  Yes [provider]  leflunomide (ARAVA) 10  MG tablet Take 10 mg by mouth daily. 06/16/17  Yes [provider]  levothyroxine (SYNTHROID, LEVOTHROID) 125 MCG tablet Take 125 mcg by mouth daily before breakfast.    Yes [provider]  losartan-hydrochlorothiazide (HYZAAR) 100-25 MG per tablet TAKE ONE TABLET EVERY DAY 03/13/14  Yes [provider]  montelukast (SINGULAIR) 10 MG tablet Take 10 mg by mouth at bedtime.  08/03/15  Yes [provider]  Multiple Vitamin (MULTI-VITAMINS) TABS Take 1 tablet by mouth daily.    Yes [provider]  traMADol (ULTRAM) 50 MG tablet Take 50 mg by mouth as needed.  08/01/14  Yes [provider]  furosemide (LASIX) 20 MG tablet  01/23/14   [provider]      PHYSICAL EXAMINATION:   VITAL SIGNS: Blood pressure (!) 149/41, pulse 97, temperature 98.1 F (36.7 C), temperature source Oral, resp. rate 18, height 5\' 3"  (1.6 m), weight 122.8 kg (270 lb 11.2 oz), SpO2 93 %.  GENERAL:  77 y.o.-year-old patient lying in the bed, in mild respiratory distress.  EYES: Pupils equal, round, reactive to light and accommodation. No scleral icterus. Extraocular muscles intact.  HEENT: Head atraumatic, normocephalic. Oropharynx and nasopharynx clear.  NECK:  Supple, no jugular venous distention. No thyroid enlargement, no tenderness.  LUNGS: Reduced breath sounds and scattered wheezing are noted bilaterally.  Currently, no use of accessory muscles of respiration.  CARDIOVASCULAR: S1, S2 normal. No S3/S4.  ABDOMEN: Soft, nontender, nondistended. Bowel sounds present. No organomegaly or mass.  EXTREMITIES: No pedal edema, cyanosis, or clubbing.  NEUROLOGIC: No focal weakness. Gait not checked.  PSYCHIATRIC: The patient is alert and oriented x 3.  SKIN: No obvious rash, lesion, or ulcer.   LABORATORY PANEL:   CBC Recent Labs  Lab 08/16/17 2037 08/17/17 0336  WBC 16.9* 13.8*  HGB 13.4 12.5  HCT 41.1 38.5  PLT 235 224  MCV 93.9 92.6  MCH 30.6 30.0   MCHC 32.5 32.4  RDW 14.9* 14.6*   ------------------------------------------------------------------------------------------------------------------  Chemistries  Recent Labs  Lab 08/16/17 2114 08/17/17 0336  NA 139 137  K 3.3* 3.4*  CL 108 102  CO2 21* 25  GLUCOSE 175* 280*  BUN 20 20  CREATININE 0.87 1.04*  CALCIUM 7.4* 8.6*   ------------------------------------------------------------------------------------------------------------------ estimated creatinine clearance is 56.7 mL/min (A) (by C-G formula based on SCr of 1.04 mg/dL (H)). ------------------------------------------------------------------------------------------------------------------ No results for input(s): TSH, T4TOTAL, T3FREE, THYROIDAB in the last 72 hours.  Invalid input(s): FREET3   Coagulation profile No results for input(s): INR, PROTIME in the last 168 hours. ------------------------------------------------------------------------------------------------------------------- No results for input(s): DDIMER in the last 72 hours. -------------------------------------------------------------------------------------------------------------------  Cardiac Enzymes Recent Labs  Lab 08/16/17 2037  TROPONINI 0.03*   ------------------------------------------------------------------------------------------------------------------ Invalid input(s): POCBNP  ---------------------------------------------------------------------------------------------------------------  Urinalysis    Component Value Date/Time   COLORURINE YELLOW (A) 08/17/2017 0223   APPEARANCEUR CLEAR (  A) 08/17/2017 0223   APPEARANCEUR Clear 08/28/2015 1509   LABSPEC 1.014 08/17/2017 0223   LABSPEC 1.014 10/08/2012 1828   PHURINE 5.0 08/17/2017 0223   GLUCOSEU NEGATIVE 08/17/2017 0223   GLUCOSEU Negative 10/08/2012 1828   HGBUR NEGATIVE 08/17/2017 0223   BILIRUBINUR NEGATIVE 08/17/2017 0223   BILIRUBINUR Negative 08/28/2015 1509    BILIRUBINUR Negative 10/08/2012 1828   KETONESUR NEGATIVE 08/17/2017 0223   PROTEINUR NEGATIVE 08/17/2017 0223   NITRITE NEGATIVE 08/17/2017 0223   LEUKOCYTESUR NEGATIVE 08/17/2017 0223   LEUKOCYTESUR Negative 08/28/2015 1509   LEUKOCYTESUR Negative 10/08/2012 1828     RADIOLOGY: Dg Chest Portable 1 View  Result Date: 08/16/2017 CLINICAL DATA:  Respiratory distress EXAM: PORTABLE CHEST 1 VIEW COMPARISON:  chest CT 03/23/2017 FINDINGS: 2016 hours. Low lung volumes with asymmetric elevation right hemidiaphragm, stable. The cardio pericardial silhouette is enlarged. Interstitial markings are diffusely coarsened with chronic features. Atelectasis or scarring noted at the lung bases. The visualized bony structures of the thorax are intact. Telemetry leads overlie the chest. IMPRESSION: Low volume film with cardiomegaly and basilar chronic atelectasis or scarring. Electronically Signed   By: Misty Stanley M.D.   On: 08/16/2017 21:39    EKG: Orders placed or performed during the hospital encounter of 08/16/17  . EKG 12-Lead  . EKG 12-Lead  . ED EKG  . ED EKG  . EKG 12-Lead  . EKG 12-Lead    IMPRESSION AND PLAN:  1.  Acute hypoxemic respiratory failure, secondary to COPD exacerbation and acute bronchitis.  We will start antibiotics steroids oxygen and nebulizer treatment. 2.  Acute COPD exacerbation, treatment as above. 3.  Acute bronchitis, treatment as above under #1. 3.  Hypertension, we will restart home medications.  All the records are reviewed and case discussed with ED provider. Management plans discussed with the patient, family and they are in agreement.  CODE STATUS:    Code Status Orders  (From admission, onward)        Start     Ordered   08/17/17 0130  Full code  Continuous     08/17/17 0130    Code Status History    Date Active Date Inactive Code Status Order ID Comments User Context   This patient has a current code status but no historical code status.     Advance Directive Documentation     Most Recent Value  Type of Advance Directive  Living will  Pre-existing out of facility DNR order (yellow form or pink MOST form)  No data  "MOST" Form in Place?  No data       TOTAL TIME TAKING CARE OF THIS PATIENT: 40 minutes.    Amelia Jo M.D on 08/17/2017 at 5:04 AM  Between 7am to 6pm - Pager - [phone]  After 6pm go to [email or web address] - password [email or web address] [facility]  [place] [facility]  Office  [phone]  CC: Primary care physician; Kirk Ruths, MD

## 2017-08-18 LAB — GLUCOSE, CAPILLARY: Glucose-Capillary: 128 mg/dL — ABNORMAL HIGH (ref 65–99)

## 2017-08-18 MED ORDER — METHYLPREDNISOLONE SODIUM SUCC 40 MG IJ SOLR
40.0000 mg | Freq: Two times a day (BID) | INTRAMUSCULAR | Status: DC
  Administered 2017-08-18 – 2017-08-19 (×2): 40 mg via INTRAVENOUS
  Filled 2017-08-18 (×2): qty 1

## 2017-08-18 NOTE — Progress Notes (Signed)
[place] at [place] NAME: Tamara Cooper    MR#:  [id number]  DATE OF BIRTH:  [birth year]  SUBJECTIVE: Seen at the bedside, admitted for COPD exacerbation, she feels slightly better today, less shortness of breath.  CHIEF COMPLAINT:   Chief Complaint  Patient presents with  . COPD    REVIEW OF SYSTEMS:   ROS CONSTITUTIONAL: No fever, fatigue or weakness.  EYES: No blurred or double vision.  EARS, NOSE, AND THROAT: No tinnitus or ear pain.  RESPIRATORY: No cough,  mild shortness of breath.  No wheezing. CARDIOVASCULAR: No chest pain, orthopnea, edema.  GASTROINTESTINAL: No nausea, vomiting, diarrhea or abdominal pain.  GENITOURINARY: No dysuria, hematuria.  ENDOCRINE: No polyuria, nocturia,  HEMATOLOGY: No anemia, easy bruising or bleeding SKIN: No rash or lesion. MUSCULOSKELETAL: No joint pain or arthritis.   NEUROLOGIC: No tingling, numbness, weakness.  PSYCHIATRY: No anxiety or depression.   DRUG ALLERGIES:   Allergies  Allergen Reactions  . Doxycycline Nausea Only  . Eggs Or Egg-Derived Products Other (See Comments)    GI bloating  . Sulfa Antibiotics Nausea Only  . Valsartan Cough and Other (See Comments)  . Aspirin Rash    Mouth ulcers  . Diltiazem Rash  . Diltiazem Hcl Rash    VITALS:  Blood pressure (!) 154/93, pulse 78, temperature 98.7 F (37.1 C), temperature source Oral, resp. rate 20, height 5\' 3"  (1.6 m), weight 118.3 kg (260 lb 11.2 oz), SpO2 93 %.  PHYSICAL EXAMINATION:  GENERAL:  77 y.o.-year-old patient lying in the bed with no acute distress.  EYES: Pupils equal, round, reactive to light and accommodation. No scleral icterus. Extraocular muscles intact.  HEENT: Head atraumatic, normocephalic. Oropharynx and nasopharynx clear.  NECK:  Supple, no jugular venous distention. No thyroid enlargement, no tenderness.  LUNGS: Normal breath sounds bilaterally, no wheezing, rales,rhonchi or crepitation. No  use of accessory muscles of respiration.  CARDIOVASCULAR: S1, S2 normal. No murmurs, rubs, or gallops.  ABDOMEN: Soft, nontender, nondistended. Bowel sounds present. No organomegaly or mass.  EXTREMITIES: No pedal edema, cyanosis, or clubbing.  NEUROLOGIC: Cranial nerves II through XII are intact. Muscle strength 5/5 in all extremities. Sensation intact. Gait not checked.  PSYCHIATRIC: The patient is alert and oriented x 3.  SKIN: No obvious rash, lesion, or ulcer.    LABORATORY PANEL:   CBC Recent Labs  Lab 08/17/17 0336  WBC 13.8*  HGB 12.5  HCT 38.5  PLT 224   ------------------------------------------------------------------------------------------------------------------  Chemistries  Recent Labs  Lab 08/17/17 0336  NA 137  K 3.4*  CL 102  CO2 25  GLUCOSE 280*  BUN 20  CREATININE 1.04*  CALCIUM 8.6*   ------------------------------------------------------------------------------------------------------------------  Cardiac Enzymes Recent Labs  Lab 08/16/17 2037  TROPONINI 0.03*   ------------------------------------------------------------------------------------------------------------------  RADIOLOGY:  Dg Chest Port 1 View  Result Date: 08/17/2017 CLINICAL DATA:  COPD exacerbation.  High blood pressure. EXAM: PORTABLE CHEST 1 VIEW COMPARISON:  08/16/2017 FINDINGS: Midline trachea. Cardiomegaly accentuated by AP portable technique. Right hemidiaphragm elevation. No definite pleural fluid. No pneumothorax. Low lung volumes with resultant pulmonary interstitial prominence. Bibasilar volume loss persists. Atherosclerosis in the transverse aorta. IMPRESSION: Low lung volumes and bibasilar atelectasis. No definite acute disease. Electronically Signed   By: Abigail Miyamoto M.D.   On: 08/17/2017 12:35   Dg Chest Portable 1 View  Result Date: 08/16/2017 CLINICAL DATA:  Respiratory distress EXAM: PORTABLE CHEST 1 VIEW COMPARISON:  chest CT 03/23/2017 FINDINGS: 2016 hours.  Low lung volumes with asymmetric elevation right hemidiaphragm, stable. The cardio pericardial silhouette is enlarged. Interstitial markings are diffusely coarsened with chronic features. Atelectasis or scarring noted at the lung bases. The visualized bony structures of the thorax are intact. Telemetry leads overlie the chest. IMPRESSION: Low volume film with cardiomegaly and basilar chronic atelectasis or scarring. Electronically Signed   By: Misty Stanley M.D.   On: 08/16/2017 21:39    EKG:   Orders placed or performed during the hospital encounter of 08/16/17  . EKG 12-Lead  . EKG 12-Lead  . ED EKG  . ED EKG  . EKG 12-Lead  . EKG 12-Lead    ASSESSMENT AND PLAN:   Acute chronic respiratory failure secondary to COPD exacerbation: Required 2-3 L of oxygen on admission.  Continue bronchodilators, IV steroids, clinically improving, likely discharge home tomorrow.  Wean off the oxygen and see how she does.  Ambulate more today. 2.  Depression: Continue Cymbalta 3.. Malignant hypertension arrival: BP improved.  Continue losartan, HCTZ, Lasix. 4.  History of rheumatoid arthritis: Follows up with Dr.  Jefm Bryant.  Continue Arava, folic acid. 5.  Hypothyroidism: Continue Synthyroid.     All the records are reviewed and case discussed with Care Management/Social Workerr. Management plans discussed with the patient, family and they are in agreement.  CODE STATUS: full  TOTAL TIME TAKING CARE OF THIS PATIENT: 32minutes.   POSSIBLE D/C IN 1-2 DAYS, DEPENDING ON CLINICAL CONDITION.   Epifanio Lesches M.D on 08/18/2017 at 12:13 PM  Between 7am to 6pm - Pager - [phone]  After 6pm go to [email or web address] - password [email or web address] [facility]  [place] [facility]  Office  [phone]  CC: Primary care physician; Kirk Ruths, MD   Note: This dictation was prepared with Dragon dictation along with smaller phrase technology. Any transcriptional errors that result from this process are  unintentional.

## 2017-08-19 LAB — GLUCOSE, CAPILLARY: Glucose-Capillary: 141 mg/dL — ABNORMAL HIGH (ref 65–99)

## 2017-08-19 MED ORDER — AZITHROMYCIN 500 MG PO TABS: 500.0000 mg | ORAL_TABLET | Freq: Every day | ORAL | 0 refills | Status: AC

## 2017-08-19 MED ORDER — PREDNISONE 10 MG (21) PO TBPK: ORAL_TABLET | ORAL | 0 refills | Status: AC

## 2017-08-19 MED ORDER — AMOXICILLIN-POT CLAVULANATE 875-125 MG PO TABS: 1.0000 | ORAL_TABLET | Freq: Two times a day (BID) | ORAL | 0 refills | Status: AC

## 2017-08-19 NOTE — Care Management Note (Signed)
Case Management Note  Patient Details  Name: Tamara Cooper MRN: [id number] Date of Birth: February 11, 1941  Subjective/Objective: Patient on nocturnal O2 through [place]. Now in need of continuous. Faxed [employer], qualifying O2 sats, demographics and O2 order. Requested they bring a portable tank for transport home. Will need a walker. Ordered from [facility]. Offered patient a choice of home health agencies. Referral to [facility] for HHPT. Patient agreeable to POC. Primary nurse updated.                    Action/Plan: [facility] for HHPT. [employer] for home O2.   Expected Discharge Date:  08/19/17               Expected Discharge Plan:  [place]  In-House Referral:     Discharge planning Services  CM Consult  Post Acute Care Choice:  Durable Medical Equipment, Home Health Choice offered to:  Patient  DME Arranged:  Walker rolling, Oxygen DME Agency:  [place] [place]  HH Arranged:  PT [facility] Agency:  [place]  Status of Service:  Completed, signed off  If discussed at [place] of Stay Meetings, dates discussed:    Additional Comments:  Jolly Mango, RN 08/19/2017, 12:11 PM

## 2017-08-19 NOTE — Progress Notes (Signed)
SATURATION QUALIFICATIONS: (This note is used to comply with regulatory documentation for home oxygen)  Patient Saturations on Room Air at Rest = 88%  Patient Saturations on Room Air while Ambulating = 84%  Patient Saturations on 2 Liters of oxygen while Ambulating = 92%  Please briefly explain why patient needs home oxygen: 

## 2017-08-19 NOTE — Plan of Care (Signed)
  Progressing Safety: Ability to remain free from injury will improve 08/19/2017 0915 - Progressing by Etheleen Nicks, RN Note Fall precautions in place, non skid socks when oob   Not Progressing Clinical Measurements: Will remain free from infection 08/19/2017 0915 - Not Progressing by Etheleen Nicks, RN Note Remains on IV antibiotics/IV steroids Diagnostic test results will improve 08/19/2017 0915 - Not Progressing by Etheleen Nicks, RN Note WBC 13.8 this am K 3.4

## 2017-08-19 NOTE — Progress Notes (Signed)
Pt discharged to home via wc and oxygen at 2L per [place]  Instructions given to pt.  Questions answered.  No distress.

## 2017-08-19 NOTE — Care Management (Signed)
[employer] called and verified patient diagnosis. They stated they would bring portable home O2 as soon as possible.

## 2017-08-19 NOTE — Evaluation (Signed)
Physical Therapy Evaluation Patient Details Name: Tamara Cooper MRN: [id number] DOB: [birth year] Today's Date: 08/19/2017   History of Present Illness  Pt admitted for COPD with hypoxia. History includes COPD and GERD. Pt currently on 3L of O2 at night.   Clinical Impression  Pt is a pleasant 77 year old female who was admitted for COPD exacerbation with hypoxia. Pt performs transfers with mod I and ambulation with cga and RW. Pt with safe technique using RW, with improved balance and decreased falls risk. Pt requesting one for home use. Pt demonstrates deficits with endurance/mobility. Still demonstrates decreased O2 sats with exertion, limiting mobility. Would benefit from skilled PT to address above deficits and promote optimal return to PLOF. Recommend transition to [place] upon discharge from acute hospitalization.       Follow Up Recommendations Home health PT    Equipment Recommendations  Rolling walker with 5" wheels    Recommendations for Other Services       Precautions / Restrictions Precautions Precautions: Fall Restrictions Weight Bearing Restrictions: No      Mobility  Bed Mobility               General bed mobility comments: not performed as pt received in recliner.  Transfers Overall transfer level: Modified independent Equipment used: None             General transfer comment: safe technique without AD required. UPright posture noted  Ambulation/Gait Ambulation/Gait assistance: Min guard Ambulation Distance (Feet): 40 Feet Assistive device: None Gait Pattern/deviations: Step-through pattern     General Gait Details: ambulated with cautious steps and 1 hand on furniture to bathroom. Pt unsteady, however no overt LOB. Pt on room air, however once in bathroom, O2 sats decrease to 84%. 2L of O2 reapplied with sats improving to 92%. Further ambulation performed with AD.  Stairs            Wheelchair Mobility    Modified Rankin (Stroke  Patients Only)       Balance Overall balance assessment: Needs assistance Sitting-balance support: Feet supported Sitting balance-Leahy Scale: Normal     Standing balance support: No upper extremity supported Standing balance-Leahy Scale: Good Standing balance comment: with dynamic balance, pt unsteady                             Pertinent Vitals/Pain Pain Assessment: No/denies pain    Home Living Family/patient expects to be discharged to:: Private residence Living Arrangements: Alone Available Help at Discharge: Family(son provides assistance when needed) Type of Home: House Home Access: Ramped entrance     Home Layout: One level Home Equipment: Cane - single point      Prior Function Level of Independence: Independent         Comments: occasionally uses SPC for mobility, however mostly furniture walks. Reports no falls     Hand Dominance        Extremity/Trunk Assessment   Upper Extremity Assessment Upper Extremity Assessment: Overall WFL for tasks assessed    Lower Extremity Assessment Lower Extremity Assessment: Overall WFL for tasks assessed       Communication   Communication: No difficulties  Cognition Arousal/Alertness: Awake/alert Behavior During Therapy: WFL for tasks assessed/performed Overall Cognitive Status: Within Functional Limits for tasks assessed(slightly forgetful of son's phone number etc)  General Comments      Exercises Other Exercises Other Exercises: Pt ambulated to bathroom with cga and only required set up and supervision for hygiene. Safe technique performed with cues for hand placement. Other Exercises: Further ambulation performed in hallway using RW and 2L of O2 x 100'. Pt with improved balance and endurance with AD, however O2 sats decrease to 86% with therapist suggesting to return to room. Pt does not exhibit SOB symptoms.   Assessment/Plan    PT  Assessment Patient needs continued PT services  PT Problem List Decreased balance;Decreased mobility;Cardiopulmonary status limiting activity       PT Treatment Interventions DME instruction;Gait training;Balance training    PT Goals (Current goals can be found in the Care Plan section)  Acute Rehab PT Goals Patient Stated Goal: to go home PT Goal Formulation: With patient Time For Goal Achievement: 09/02/17 Potential to Achieve Goals: Good    Frequency Min 2X/week   Barriers to discharge        Co-evaluation               AM-PAC PT "6 Clicks" Daily Activity  Outcome Measure Difficulty turning over in bed (including adjusting bedclothes, sheets and blankets)?: None Difficulty moving from lying on back to sitting on the side of the bed? : None Difficulty sitting down on and standing up from a chair with arms (e.g., wheelchair, bedside commode, etc,.)?: None Help needed moving to and from a bed to chair (including a wheelchair)?: None Help needed walking in hospital room?: A Little Help needed climbing 3-5 steps with a railing? : A Little 6 Click Score: 22    End of Session Equipment Utilized During Treatment: Gait belt Activity Tolerance: Patient tolerated treatment well Patient left: in chair;with chair alarm set Nurse Communication: Mobility status PT Visit Diagnosis: Unsteadiness on feet (R26.81);Difficulty in walking, not elsewhere classified (R26.2)    Time: 9323-5573 PT Time Calculation (min) (ACUTE ONLY): 25 min   Charges:   PT Evaluation $PT Eval Low Complexity: 1 Low PT Treatments $Gait Training: 8-22 mins   PT G CodesGreggory Cooper, PT, DPT [phone]   Tamara Cooper 08/19/2017, 11:52 AM

## 2017-08-19 NOTE — Discharge Summary (Signed)
Tamara Cooper, is a 77 y.o. female  DOB [birth year]  MRN [id number].  Admission date:  08/16/2017  Admitting Physician  Amelia Jo, MD  Discharge Date:  08/19/2017   Primary MD  Kirk Ruths, MD  Recommendations for primary care physician for things to follow:  Follow-up with PCP in 1 week Follow-up with Dr. Raul Del in 2 weeks    Admission Diagnosis  COPD with acute exacerbation ([place]) [J44.1] Hypertension, unspecified type [I10]   Discharge Diagnosis  COPD with acute exacerbation ([place]) [J44.1] Hypertension, unspecified type [I10]    Active Problems:   COPD with hypoxia [facility])      Past Medical History:  Diagnosis Date  . Arthritis   . Asthma   . Cancer ([place])    skin cancer basil cell  . COPD (chronic obstructive pulmonary disease) ([place])   . GERD (gastroesophageal reflux disease)     Past Surgical History:  Procedure Laterality Date  . ABDOMINAL HYSTERECTOMY    . basil cell excision Right    right breast  . BREAST BIOPSY Right 1997, 2006   Dystrophic calcifications without malignancy.  Marland Kitchen BREAST BIOPSY Left 2012   Dystrophic calcifications without malignancy.  . CHOLECYSTECTOMY    . KIDNEY SURGERY         History of present illness and  Hospital Course:     Kindly see H&P for history of present illness and admission details, please review complete Labs, Consult reports and Test reports for all details in brief  HPI  from the history and physical done on the day of admission 77 year old female patient admitted because of COPD exacerbation.  Patient uses oxygen at night but has to use oxygen during daytime also recently.   Hospital Course  1. acute on chronic respiratory failure with COPD exacerbation: Requiring oxygen during daytime also.  Patient had shortness of breath, right-sided chest  pain on the right breast, dry cough.  No fever or chills.  Oxygen saturation 90% on room air.  White count 16.9.  Chest x-ray did not show any infiltrate.  Admitted for hospitalist service, started on bronchodilators, IV steroids, IV Rocephin, Zithromax.  Patient feels really good today, oxygen saturation 95% on 1 L.  We will get physical therapy evaluation before she goes home and see ambulatory oxygen saturation on room air to see if she qualifies for oxygen during daytime.  She can continue Augmentin for 7 days, azithromycin 500 mg for 3 days, prednisone tablet pack.  2.  History of rheumatoid arthritis: Patient  On Arava. tramadol, folic acid, follows up with Dr. Jefm Bryant.  3.  History of GERD: Continue PPIs 4.  History of COPD and follows up with Dr. Raul Del #5 .  Malignant hypertension on arrival, b BP more than 200.  Patient received clonidine, restarted her BP medicines, blood pressure.  Patient can continue her home medicines with losartan/HCTZ.  She is not taking Lasix anymore.  Discharge Condition: stable   Follow UP Follow-up with PCP in 1 week Follow-up with Dr. Raul Del in 2 weeks.   Follow-up with Dr. Precious Reel as scheduled for her follow-up of rheumatoid arthritis.     Discharge Instructions  and  Discharge Medications      Allergies as of 08/19/2017      Reactions   Doxycycline Nausea Only   Eggs Or Egg-derived Products Other (See Comments)   GI bloating   Sulfa Antibiotics Nausea Only   Valsartan Cough, Other (See Comments)   Aspirin Rash  Mouth ulcers   Diltiazem Rash   Diltiazem Hcl Rash      Medication List    STOP taking these medications   furosemide 20 MG tablet Commonly known as:  LASIX     TAKE these medications   amoxicillin-clavulanate 875-125 MG tablet Commonly known as:  AUGMENTIN Take 1 tablet by mouth 2 (two) times daily for 14 days.   azithromycin 500 MG tablet Commonly known as:  ZITHROMAX Take 1 tablet (500 mg total) by mouth  daily for 3 days. Take 1 tablet daily for 3 days.   DULoxetine 20 MG capsule Commonly known as:  CYMBALTA Take 20 mg by mouth daily.   esomeprazole 40 MG capsule Commonly known as:  NEXIUM Take 40 mg by mouth as needed.   folic acid 1 MG tablet Commonly known as:  FOLVITE Take 1 mg by mouth daily. 1mg  - 2 tablets by mouth once day.   leflunomide 10 MG tablet Commonly known as:  ARAVA Take 10 mg by mouth daily.   levothyroxine 125 MCG tablet Commonly known as:  SYNTHROID, LEVOTHROID Take 125 mcg by mouth daily before breakfast.   losartan-hydrochlorothiazide 100-25 MG tablet Commonly known as:  HYZAAR TAKE ONE TABLET EVERY DAY   montelukast 10 MG tablet Commonly known as:  SINGULAIR Take 10 mg by mouth at bedtime.   MULTI-VITAMINS Tabs Take 1 tablet by mouth daily.   predniSONE 10 MG (21) Tbpk tablet Commonly known as:  STERAPRED UNI-PAK 21 TAB Taper by 10 mg daily   PROAIR HFA 108 (90 Base) MCG/ACT inhaler Generic drug:  albuterol TAKE 2 PUFFS 4 TIMES A DAY AS NEEDED   traMADol 50 MG tablet Commonly known as:  ULTRAM Take 50 mg by mouth as needed.   VITAMIN D-1000 MAX ST 1000 units tablet Generic drug:  Cholecalciferol Take 1,000 Units by mouth daily.         Diet and Activity recommendation: See Discharge Instructions above   Consults obtained -none   Major procedures and Radiology Reports - PLEASE review detailed and final reports for all details, in brief -     Dg Chest Port 1 View  Result Date: 08/17/2017 CLINICAL DATA:  COPD exacerbation.  High blood pressure. EXAM: PORTABLE CHEST 1 VIEW COMPARISON:  08/16/2017 FINDINGS: Midline trachea. Cardiomegaly accentuated by AP portable technique. Right hemidiaphragm elevation. No definite pleural fluid. No pneumothorax. Low lung volumes with resultant pulmonary interstitial prominence. Bibasilar volume loss persists. Atherosclerosis in the transverse aorta. IMPRESSION: Low lung volumes and bibasilar  atelectasis. No definite acute disease. Electronically Signed   By: Abigail Miyamoto M.D.   On: 08/17/2017 12:35   Dg Chest Portable 1 View  Result Date: 08/16/2017 CLINICAL DATA:  Respiratory distress EXAM: PORTABLE CHEST 1 VIEW COMPARISON:  chest CT 03/23/2017 FINDINGS: 2016 hours. Low lung volumes with asymmetric elevation right hemidiaphragm, stable. The cardio pericardial silhouette is enlarged. Interstitial markings are diffusely coarsened with chronic features. Atelectasis or scarring noted at the lung bases. The visualized bony structures of the thorax are intact. Telemetry leads overlie the chest. IMPRESSION: Low volume film with cardiomegaly and basilar chronic atelectasis or scarring. Electronically Signed   By: Misty Stanley M.D.   On: 08/16/2017 21:39    Micro Results   No results found for this or any previous visit (from the past 240 hour(s)).     Today   Subjective:   Tamara Cooper today has no headache,no chest abdominal pain,no new weakness tingling or numbness, feels much better wants  to go home today.  Objective:   Blood pressure 136/63, pulse 83, temperature (!) 97.5 F (36.4 C), temperature source Oral, resp. rate 17, height 5\' 3"  (1.6 m), weight 117.8 kg (259 lb 12.8 oz), SpO2 97 %.   Intake/Output Summary (Last 24 hours) at 08/19/2017 0813 Last data filed at 08/19/2017 0543 Gross per 24 hour  Intake 950 ml  Output -  Net 950 ml    Exam Awake Alert, Oriented x 3, No new F.N deficits, Normal affect [place].AT,PERRAL Supple Neck,No JVD, No cervical lymphadenopathy appriciated.  Symmetrical Chest wall movement, Good air movement bilaterally, CTAB RRR,No Gallops,Rubs or new Murmurs, No Parasternal Heave +ve B.Sounds, Abd Soft, Non tender, No organomegaly appriciated, No rebound -guarding or rigidity. No Cyanosis, Clubbing or edema, No new Rash or bruise  Data Review   CBC w Diff:  Lab Results  Component Value Date   WBC 13.8 (H) 08/17/2017   HGB 12.5 08/17/2017    HGB 14.2 10/08/2012   HCT 38.5 08/17/2017   HCT 43.6 10/08/2012   PLT 224 08/17/2017   PLT 277 10/08/2012    CMP:  Lab Results  Component Value Date   NA 137 08/17/2017   NA 138 10/08/2012   K 3.4 (L) 08/17/2017   K 3.8 10/08/2012   CL 102 08/17/2017   CL 102 10/08/2012   CO2 25 08/17/2017   CO2 32 10/08/2012   BUN 20 08/17/2017   BUN 21 (H) 10/08/2012   CREATININE 1.04 (H) 08/17/2017   CREATININE 0.99 10/08/2012   PROT 7.9 10/08/2012   ALBUMIN 3.6 10/08/2012   BILITOT 0.3 10/08/2012   ALKPHOS 53 10/08/2012   AST 46 (H) 10/08/2012   ALT 47 10/08/2012  .   Total Time in preparing paper work, data evaluation and todays exam - 35 minutes  Epifanio Lesches M.D on 08/19/2017 at 8:13 AM    Note: This dictation was prepared with Dragon dictation along with smaller phrase technology. Any transcriptional errors that result from this process are unintentional.

## 2017-08-29 ENCOUNTER — Emergency Department: Payer: Medicare Other

## 2017-08-29 ENCOUNTER — Emergency Department
Admission: EM | Admit: 2017-08-29 | Discharge: 2017-09-11 | Disposition: E | Payer: Medicare Other | Attending: Emergency Medicine | Admitting: Emergency Medicine

## 2017-08-29 DIAGNOSIS — I469 Cardiac arrest, cause unspecified: Secondary | ICD-10-CM | POA: Diagnosis present

## 2017-08-29 DIAGNOSIS — Z79899 Other long term (current) drug therapy: Secondary | ICD-10-CM | POA: Insufficient documentation

## 2017-08-29 DIAGNOSIS — J449 Chronic obstructive pulmonary disease, unspecified: Secondary | ICD-10-CM | POA: Insufficient documentation

## 2017-08-29 DIAGNOSIS — Z85828 Personal history of other malignant neoplasm of skin: Secondary | ICD-10-CM | POA: Insufficient documentation

## 2017-08-29 DIAGNOSIS — J45909 Unspecified asthma, uncomplicated: Secondary | ICD-10-CM | POA: Diagnosis not present

## 2017-08-29 LAB — COMPREHENSIVE METABOLIC PANEL
ALT: 93 U/L — ABNORMAL HIGH (ref 14–54)
AST: 142 U/L — ABNORMAL HIGH (ref 15–41)
Albumin: 2.7 g/dL — ABNORMAL LOW (ref 3.5–5.0)
Alkaline Phosphatase: 65 U/L (ref 38–126)
Anion gap: 23 — ABNORMAL HIGH (ref 5–15)
BUN: 21 mg/dL — ABNORMAL HIGH (ref 6–20)
CHLORIDE: 98 mmol/L — AB (ref 101–111)
CO2: 18 mmol/L — ABNORMAL LOW (ref 22–32)
Calcium: 9.1 mg/dL (ref 8.9–10.3)
Creatinine, Ser: 1.6 mg/dL — ABNORMAL HIGH (ref 0.44–1.00)
GFR, EST AFRICAN AMERICAN: 34 mL/min — AB (ref >60)
GFR, EST NON AFRICAN AMERICAN: 30 mL/min — AB (ref >60)
Glucose, Bld: 294 mg/dL — ABNORMAL HIGH (ref 65–99)
POTASSIUM: 3.3 mmol/L — AB (ref 3.5–5.1)
SODIUM: 139 mmol/L (ref 135–145)
Total Bilirubin: 1.4 mg/dL — ABNORMAL HIGH (ref 0.3–1.2)
Total Protein: 6.5 g/dL (ref 6.5–8.1)

## 2017-08-29 LAB — CBC WITH DIFFERENTIAL/PLATELET
BASOS ABS: 0.1 10*3/uL (ref 0–0.1)
BASOS PCT: 1 %
Eosinophils Absolute: 0.2 10*3/uL (ref 0–0.7)
Eosinophils Relative: 1 %
HEMATOCRIT: 45.1 % (ref 35.0–47.0)
HEMOGLOBIN: 13.5 g/dL (ref 12.0–16.0)
LYMPHS PCT: 29 %
Lymphs Abs: 5.6 10*3/uL — ABNORMAL HIGH (ref 1.0–3.6)
MCH: 29.8 pg (ref 26.0–34.0)
MCHC: 30 g/dL — ABNORMAL LOW (ref 32.0–36.0)
MCV: 99.5 fL (ref 80.0–100.0)
MONO ABS: 1.2 10*3/uL — AB (ref 0.2–0.9)
Monocytes Relative: 6 %
NEUTROS ABS: 12.6 10*3/uL — AB (ref 1.4–6.5)
NEUTROS PCT: 63 %
Platelets: 43 10*3/uL — ABNORMAL LOW (ref 150–440)
RBC: 4.53 MIL/uL (ref 3.80–5.20)
RDW: 15.8 % — ABNORMAL HIGH (ref 11.5–14.5)
WBC: 19.7 10*3/uL — AB (ref 3.6–11.0)

## 2017-08-29 LAB — LACTIC ACID, PLASMA: Lactic Acid, Venous: 10.7 mmol/L (ref 0.5–1.9)

## 2017-08-29 LAB — TROPONIN I: Troponin I: 0.12 ng/mL (ref <0.03)

## 2017-08-29 MED ORDER — DOPAMINE-DEXTROSE 3.2-5 MG/ML-% IV SOLN
0.0000 ug/kg/min | Freq: Once | INTRAVENOUS | Status: AC
  Administered 2017-08-29: 10 ug/kg/min via INTRAVENOUS

## 2017-08-29 MED ORDER — PIPERACILLIN-TAZOBACTAM 3.375 G IVPB
INTRAVENOUS | Status: AC
  Administered 2017-08-29: 3.375 g via INTRAVENOUS
  Filled 2017-08-29: qty 50

## 2017-08-29 MED ORDER — NOREPINEPHRINE BITARTRATE 1 MG/ML IV SOLN
0.0000 ug/min | Freq: Once | INTRAVENOUS | Status: AC
  Administered 2017-08-29: 20 ug/min via INTRAVENOUS

## 2017-08-29 MED ORDER — EPINEPHRINE PF 1 MG/10ML IJ SOSY
PREFILLED_SYRINGE | INTRAMUSCULAR | Status: DC | PRN
  Administered 2017-08-29 (×4): 1 mg via INTRAVENOUS

## 2017-08-29 MED ORDER — SODIUM CHLORIDE 0.9 % IV SOLN
INTRAVENOUS | Status: DC | PRN
  Administered 2017-08-29: 1000 mL via INTRAVENOUS

## 2017-08-29 MED ORDER — DOPAMINE-DEXTROSE 3.2-5 MG/ML-% IV SOLN
INTRAVENOUS | Status: AC
  Filled 2017-08-29: qty 250

## 2017-08-29 MED ORDER — ATROPINE SULFATE 1 MG/ML IJ SOLN
INTRAMUSCULAR | Status: DC | PRN
  Administered 2017-08-29 (×2): .5 mg via INTRAVENOUS

## 2017-08-29 MED ORDER — IPRATROPIUM-ALBUTEROL 0.5-2.5 (3) MG/3ML IN SOLN
9.0000 mL | Freq: Once | RESPIRATORY_TRACT | Status: AC
  Administered 2017-08-29: 9 mL via RESPIRATORY_TRACT
  Filled 2017-08-29: qty 9

## 2017-08-29 MED ORDER — SODIUM CHLORIDE 0.9 % IV BOLUS (SEPSIS)
1000.0000 mL | Freq: Once | INTRAVENOUS | Status: AC
  Administered 2017-08-29: 1000 mL via INTRAVENOUS

## 2017-08-29 MED ORDER — PIPERACILLIN-TAZOBACTAM 3.375 G IVPB 30 MIN
3.3750 g | Freq: Once | INTRAVENOUS | Status: AC
  Administered 2017-08-29: 3.375 g via INTRAVENOUS

## 2017-08-29 MED ORDER — VANCOMYCIN HCL IN DEXTROSE 1-5 GM/200ML-% IV SOLN
1000.0000 mg | Freq: Once | INTRAVENOUS | Status: AC
  Administered 2017-08-29: 1000 mg via INTRAVENOUS

## 2017-08-29 MED ORDER — SODIUM BICARBONATE 8.4 % IV SOLN
INTRAVENOUS | Status: DC | PRN
  Administered 2017-08-29 (×2): 50 meq via INTRAVENOUS

## 2017-08-29 MED ORDER — VANCOMYCIN HCL IN DEXTROSE 1-5 GM/200ML-% IV SOLN
INTRAVENOUS | Status: AC
  Administered 2017-08-29: 1000 mg via INTRAVENOUS
  Filled 2017-08-29: qty 200

## 2017-08-29 MED ORDER — METHYLPREDNISOLONE SODIUM SUCC 125 MG IJ SOLR
INTRAMUSCULAR | Status: AC
  Administered 2017-08-29: 125 mg
  Filled 2017-08-29: qty 2

## 2017-08-30 LAB — BLOOD CULTURE ID PANEL (REFLEXED)
Acinetobacter baumannii: NOT DETECTED
CANDIDA ALBICANS: NOT DETECTED
CANDIDA GLABRATA: NOT DETECTED
CANDIDA PARAPSILOSIS: NOT DETECTED
CANDIDA TROPICALIS: NOT DETECTED
Candida krusei: NOT DETECTED
ENTEROBACTERIACEAE SPECIES: NOT DETECTED
Enterobacter cloacae complex: NOT DETECTED
Enterococcus species: NOT DETECTED
Escherichia coli: NOT DETECTED
HAEMOPHILUS INFLUENZAE: NOT DETECTED
KLEBSIELLA OXYTOCA: NOT DETECTED
KLEBSIELLA PNEUMONIAE: NOT DETECTED
Listeria monocytogenes: NOT DETECTED
METHICILLIN RESISTANCE: DETECTED — AB
Neisseria meningitidis: NOT DETECTED
PROTEUS SPECIES: NOT DETECTED
Pseudomonas aeruginosa: NOT DETECTED
STREPTOCOCCUS PYOGENES: NOT DETECTED
Serratia marcescens: NOT DETECTED
Staphylococcus aureus (BCID): NOT DETECTED
Staphylococcus species: DETECTED — AB
Streptococcus agalactiae: NOT DETECTED
Streptococcus pneumoniae: NOT DETECTED
Streptococcus species: NOT DETECTED

## 2017-08-31 MED FILL — Medication: Qty: 1 | Status: AC

## 2017-09-01 LAB — CULTURE, BLOOD (ROUTINE X 2): Special Requests: ADEQUATE

## 2017-09-07 LAB — BLOOD GAS, VENOUS
PATIENT TEMPERATURE: 37
PCO2 VEN: 29 mmHg — AB (ref 44.0–60.0)

## 2017-09-11 NOTE — ED Notes (Signed)
Pt noted to have blood stinged sputum coming from around ETT tube

## 2017-09-11 NOTE — ED Notes (Signed)
Time of death called

## 2017-09-11 NOTE — ED Notes (Signed)
Per Dr. Clearnce Hasten, do not titrate up on presser medications

## 2017-09-11 NOTE — ED Notes (Signed)
Spoke with Murlean Caller at [facility]. Pt is suitable for tissue donation only.   Reference number [id number]

## 2017-09-11 NOTE — Progress Notes (Signed)
°   09/05/17 1545  Clinical Encounter Type  Visited With Family  Visit Type Follow-up  Referral From Nurse  Consult/Referral To Chaplain  Spiritual Encounters  Spiritual Needs Prayer;Emotional;Grief support   CH followed-up with PT's family. Family was waiting for other family members to arrive. PT expired with family in the room. [place] prayed and offered support.

## 2017-09-11 NOTE — Code Documentation (Signed)
Pt currently being paced

## 2017-09-11 NOTE — ED Triage Notes (Signed)
Pt brought in by [facility] from home. Per EMS they were called out for respiratory distress, when they arrived pt had agonal respirations at 4-6 per minutes. EMS reports that pt then stopped breathing and lost pulses. Pt was given 2 rounds of epi and chest compression were started at 1130. EMS got pulses back at approximately 1144. Pt CBG was 379.

## 2017-09-11 NOTE — Progress Notes (Signed)
Pt was brought in after having a cardiac arrest and CPR secondary to respi distress at home. CPR in progress by EMS. Intubated, on ventilatory support, maximum vasodepressors supports for blood pressure, dilated and fixed pupils. The patient's son, daughter-in-law, friend in the room. They're still waiting for 1 siblings and one grandson who lives locally to arrive to hospital. I had a brief discussion with present family members in the room, regarding their further plans as I was asked to Dr. family and decide about admission.  I had discussed with the present family members in the room, about her current condition and outcomes. I have clearly explained them that currently her heart and bleeding is most likely running on artificial support. They made it clear to me that once the remaining family members arrive ( they are local and on their way), they would like to withdraw all the support as they understand they would not be a different outcome by keeping her on all the support in ICU.  I spoke to ER physician in ER nurses, as this is just a matter of a few minutes to less than an hour, keep her monitoring in ER and if there is any different outcome or change in the plan, we can be called again for admitting the patient.  Time spent in this is 20 minutes.

## 2017-09-11 NOTE — Code Documentation (Signed)
Compressions paused for pulse check, pulses present.

## 2017-09-11 NOTE — Code Documentation (Signed)
No pulses present, compressions started

## 2017-09-11 NOTE — Code Documentation (Signed)
Pt son at bedside.

## 2017-09-11 NOTE — Code Documentation (Signed)
Pt heart rate decreasing, Per Dr. Clearnce Hasten, will begin pacing patient

## 2017-09-11 NOTE — Code Documentation (Addendum)
Pt started to brady down, pulses checked  No pulses present, Dr. Clearnce Hasten notified, Compressions restarted with lucas.

## 2017-09-11 NOTE — ED Notes (Signed)
Dr. Clearnce Hasten informed of critical pH of less than 6.9

## 2017-09-11 NOTE — Code Documentation (Signed)
CPR paused for pulse check, strong pulses present.

## 2017-09-11 NOTE — ED Notes (Signed)
Levophed and dopamine infusion being moved to IO line. Per Dr. Clearnce Hasten, do not increase Levophed dose at this time until we see what pressure does with pressers through IO.

## 2017-09-11 NOTE — Progress Notes (Signed)
°   13-Sep-2017 1250  Clinical Encounter Type  Visited With Family  Visit Type Initial  Referral From Nurse  Consult/Referral To Chaplain  Spiritual Encounters  Spiritual Needs Prayer;Emotional;Grief support   CH was PG to report to the ED. [place] reported and meet family of PT. CH offered spiritual and emotional support. CH will follow-up

## 2017-09-11 NOTE — ED Notes (Signed)
Patient family at bedside  

## 2017-09-11 NOTE — Code Documentation (Signed)
Levophed started at 20 mcg/min

## 2017-09-11 NOTE — Code Documentation (Signed)
Pupils fixed and dilated per Dr. Clearnce Hasten.

## 2017-09-11 NOTE — ED Notes (Signed)
Pt grandson arrived at bedside

## 2017-09-11 NOTE — ED Notes (Signed)
Unable to feel carotid pulse Dr Clearnce Hasten notified - BP not picking up reading - recycled x3

## 2017-09-11 NOTE — ED Provider Notes (Addendum)
[facility] Emergency Department Provider Note  ____________________________________________   First MD Initiated Contact with Patient 27-Sep-2017 1233     (approximate)  I have reviewed the triage vital signs and the nursing notes.   HISTORY  Chief Complaint Cardiac Arrest   HPI Tamara Cooper is a 77 y.o. female with history of COPD as well as reflux who is presenting to the emergency department today after a respiratory arrest.  The patient was with her son when she became unresponsive with 4-6 breaths/min.  EMS was called.  With EMS, the patient required bag valve mask to assist with respirations.  Pulses were lost and the patient received 2 rounds of epinephrine as well as CPR.  Pulses returned.  The patient was placed on a King LT and transported to the emergency department.   Patient is unresponsive at this point unable to give history.  Past Medical History:  Diagnosis Date  . Arthritis   . Asthma   . Cancer ([place])    skin cancer basil cell  . COPD (chronic obstructive pulmonary disease) ([place])   . GERD (gastroesophageal reflux disease)     Patient Active Problem List   Diagnosis Date Noted  . COPD with hypoxia ([place]) 08/17/2017  . Renal mass 08/29/2015  . Urinary urgency 08/29/2015  . Encounter for general adult medical examination without abnormal findings 03/23/2015  . Rheumatoid arthritis involving multiple joints ([place]) 12/26/2014  . Breast microcalcification, mammographic 08/14/2014  . Major depression in remission ([place]) 05/26/2014  . Other long term (current) drug therapy 12/01/2013  . Acquired hypothyroidism 11/19/2013  . Arthritis 11/19/2013  . Airway hyperreactivity 11/19/2013  . Acid reflux 11/19/2013  . Morbid (severe) obesity due to excess calories ([place]) 11/19/2013  . Arthritis, degenerative 11/19/2013  . Seropositive rheumatoid arthritis ([place]) 11/19/2013  . Apnea, sleep 11/19/2013  . Disease of thyroid gland 11/19/2013     Past Surgical History:  Procedure Laterality Date  . ABDOMINAL HYSTERECTOMY    . basil cell excision Right    right breast  . BREAST BIOPSY Right 1997, 2006   Dystrophic calcifications without malignancy.  Marland Kitchen BREAST BIOPSY Left 2012   Dystrophic calcifications without malignancy.  . CHOLECYSTECTOMY    . KIDNEY SURGERY      Prior to Admission medications   Medication Sig Start Date End Date Taking? Authorizing Provider  albuterol (PROAIR HFA) 108 (90 BASE) MCG/ACT inhaler TAKE 2 PUFFS 4 TIMES A DAY AS NEEDED 06/13/14   [provider]  amoxicillin-clavulanate (AUGMENTIN) 875-125 MG tablet Take 1 tablet by mouth 2 (two) times daily for 14 days. 08/19/17 09/02/17  Epifanio Lesches, MD  Cholecalciferol (VITAMIN D-1000 MAX ST) 1000 units tablet Take 1,000 Units by mouth daily.     [provider]  DULoxetine (CYMBALTA) 20 MG capsule Take 20 mg by mouth daily.    [provider]  esomeprazole (NEXIUM) 40 MG capsule Take 40 mg by mouth as needed.     [provider]  folic acid (FOLVITE) 1 MG tablet Take 1 mg by mouth daily. 1mg  - 2 tablets by mouth once day. 01/10/14   [provider]  leflunomide (ARAVA) 10 MG tablet Take 10 mg by mouth daily. 06/16/17   [provider]  levothyroxine (SYNTHROID, LEVOTHROID) 125 MCG tablet Take 125 mcg by mouth daily before breakfast.     [provider]  losartan-hydrochlorothiazide (HYZAAR) 100-25 MG per tablet TAKE ONE TABLET EVERY DAY 03/13/14   [provider]  montelukast (  SINGULAIR) 10 MG tablet Take 10 mg by mouth at bedtime.  08/03/15   [provider]  Multiple Vitamin (MULTI-VITAMINS) TABS Take 1 tablet by mouth daily.     [provider]  predniSONE (STERAPRED UNI-PAK 21 TAB) 10 MG (21) TBPK tablet Taper by 10 mg daily 08/19/17   Epifanio Lesches, MD  traMADol (ULTRAM) 50 MG tablet Take 50 mg by mouth as needed.  08/01/14   [provider]     Allergies Doxycycline; Eggs or egg-derived products; Sulfa antibiotics; Valsartan; Aspirin; Diltiazem; and Diltiazem hcl  Family History  Problem Relation Age of Onset  . Cancer Father   . Breast cancer Neg Hx     Social History Social History   Tobacco Use  . Smoking status: Never Smoker  . Smokeless tobacco: Never Used  Substance Use Topics  . Alcohol use: No    Alcohol/week: 0.0 oz  . Drug use: No    Review of Systems Level 5 caveat secondary to unresponsive  ____________________________________________   PHYSICAL EXAM:  VITAL SIGNS: ED Triage Vitals  Enc Vitals Group     BP 19-Sep-2017 1217 130/77     Pulse Rate 09-19-2017 1209 96     Resp 2017-09-19 1209 12     Temp --      Temp src --      SpO2 2017/09/19 1222 92 %     Weight --      Height --      Head Circumference --      Peak Flow --      Pain Score --      Pain Loc --      Pain Edu? --      Excl. in [place]? --     Constitutional: GCS of 3.  King LT in place. Eyes: Conjunctivae are normal.  Pupils are fixed and dilated. Head: Atraumatic. Nose: No congestion/rhinnorhea. Mouth/Throat: King LT in place  neck: No stridor.   Cardiovascular: Tachycardic with an irregular rhythm. Grossly normal heart sounds.   Respiratory: Normal respiratory effort.  No retractions.  Prolonged expiratory phase with slightly decreased air movement. Gastrointestinal: Soft and no distention.  Musculoskeletal: Minimal bilateral lower extremity edema.  Intraosseous line in place in the proximal medial tibia. Neurologic: GCS of 3  skin:  Skin is warm, dry and intact. No rash noted.   ____________________________________________   LABS (all labs ordered are listed, but only abnormal results are displayed)  Labs Reviewed  CBC WITH DIFFERENTIAL/PLATELET - Abnormal; Notable for the following components:      Result Value   WBC 19.7 (*)    MCHC 30.0 (*)    RDW 15.8 (*)    Platelets 43 (*)    Neutro Abs 12.6 (*)    Lymphs  Abs 5.6 (*)    Monocytes Absolute 1.2 (*)    All other components within normal limits  LACTIC ACID, PLASMA - Abnormal; Notable for the following components:   Lactic Acid, Venous 10.7 (*)    All other components within normal limits  TROPONIN I - Abnormal; Notable for the following components:   Troponin I 0.12 (*)    All other components within normal limits  COMPREHENSIVE METABOLIC PANEL - Abnormal; Notable for the following components:   Potassium 3.3 (*)    Chloride 98 (*)    CO2 18 (*)    Glucose, Bld 294 (*)    BUN 21 (*)    Creatinine, Ser 1.60 (*)    Albumin  2.7 (*)    AST 142 (*)    ALT 93 (*)    Total Bilirubin 1.4 (*)    GFR calc non Af Amer 30 (*)    GFR calc Af Amer 34 (*)    Anion gap 23 (*)    All other components within normal limits  BLOOD GAS, VENOUS - Abnormal; Notable for the following components:   pH, Ven <6.900 (*)    pCO2, Ven 29 (*)    All other components within normal limits  CULTURE, BLOOD (ROUTINE X 2)  CULTURE, BLOOD (ROUTINE X 2)  LACTIC ACID, PLASMA  URINALYSIS, COMPLETE (UACMP) WITH MICROSCOPIC   ____________________________________________  EKG  ED ECG REPORT I, Doran Stabler, the attending physician, personally viewed and interpreted this ECG.   Date: September 13, 2017  EKG Time: 1214  Rate: 89  Rhythm: Sinus wave seen in lead II.  Likely accelerated junctional rhythm versus sinus tachycardia  Axis: Normal  Intervals:right bundle branch block  ST&T Change: T wave inversions in V2 as well as aVL.  Likely wandering baseline causing appearance of ST elevation in lead III and aVF.  Minimal depression in aVL as well as 1.  ED ECG REPORT I, Doran Stabler, the attending physician, personally viewed and interpreted this ECG.   Date: 09/13/17  EKG Time: 1224  Rate: 149  Rhythm: sinus tachycardia  Axis: Normal  Intervals:nonspecific intraventricular conduction delay  ST&T Change: Persistent depressions in 1 and aVL without any ST  elevations.  T wave inversions in leads aVL as well as V2.   ____________________________________________  RADIOLOGY   ____________________________________________   PROCEDURES  Procedure(s) performed:    Procedure Name: Intubation Date/Time: 2017-09-13 2:08 PM Performed by: Orbie Pyo, MD Pre-anesthesia Checklist: Patient identified, Emergency Drugs available, Suction available and Patient being monitored Preoxygenation: Pre-oxygenation with 100% oxygen Ventilation: Oral airway inserted - appropriate to patient size Laryngoscope Size: Glidescope and 4 Grade View: Grade I Tube size: 7.5 mm Number of attempts: 1 Airway Equipment and Method: Stylet Placement Confirmation: ETT inserted through vocal cords under direct vision,  CO2 detector and Breath sounds checked- equal and bilateral Tube secured with: ETT holder Comments: Pt did not require paralysis    .Critical Care Performed by: Orbie Pyo, MD Authorized by: Orbie Pyo, MD   Critical care provider statement:    Critical care time (minutes):  60   Critical care time was exclusive of:  Separately billable procedures and treating other patients   Critical care was necessary to treat or prevent imminent or life-threatening deterioration of the following conditions:  Cardiac failure and CNS failure or compromise   Critical care was time spent personally by me on the following activities:  Blood draw for specimens and discussions with consultants    Critical Care performed:   ____________________________________________   INITIAL IMPRESSION / ASSESSMENT AND PLAN / ED COURSE  Pertinent labs & imaging results that were available during my care of the patient were reviewed by me and considered in my medical decision making (see chart for details).  Differential diagnosis includes, but is not limited to, alcohol, illicit or prescription medications, or other toxic ingestion;  intracranial pathology such as stroke or intracerebral hemorrhage; fever or infectious causes including sepsis; hypoxemia and/or hypercarbia; uremia; trauma; endocrine related disorders such as diabetes, hypoglycemia, and thyroid-related diseases; hypertensive encephalopathy; cardiac arrest, respiratory arrest, hypoxia, MI, PE As part of my medical decision making, I reviewed the following data within the [facility]  Notes from prior ED visits  ----------------------------------------- 2:11 PM on Sep 20, 2017 -----------------------------------------  Patient arrived in the emergency department in extremely critical condition.  Soon after arrival the patient had bradycardia initially treated with atropine and then with about 1 minute of pacing.  However, the patient continued to bradycardia and we began CPR.  Patient given 2 rounds of epinephrine, bicarbonate and calcium.  Pulses were regained.  Levophed was started.  However, the patient bradyed again.  We started CPR and gave epinephrine.  Pulses returned.  Levophed was continued and dopamine was added.  We were able to transition the patient to an ET tube.  Using the tibial IO for central access.  Multiple family members at bedside including sons and daughter-in-law.  We discussed the patient's extremely critical condition as well as mentation.  We had an extensive discussion with multiple family members regarding low probability for meaningful survival secondary to multiple rounds of CPR and anoxic brain injury.  The patient at this point has a heart rate of 80 as well as a blood pressure of 85/58.  The family now is waiting for other family members to come to the hospital at which point they feel that they will withdraw care.  Patient discussed with medicine service who had a similar discussion with the family.  Because family members should all arrived to the emergency department within about 30 minutes, the patient will be maintained in  the emergency department and the withdrawal will likely happen here.  ----------------------------------------- 3:12 PM on 09/20/17 -----------------------------------------  Just as family arrived, the patient was having long pauses of asystole interrupted by several wide-complex beats without palpable carotid pulses.  As per discussions with the family, care was discontinued at this time..  Time of death was 69.     ____________________________________________   FINAL CLINICAL IMPRESSION(S) / ED DIAGNOSES  Cardiac arrest. Agonal respirations.   NEW MEDICATIONS STARTED DURING THIS VISIT:  New Prescriptions   No medications on file     Note:  This document was prepared using Dragon voice recognition software and may include unintentional dictation errors.     Orbie Pyo, MD 09-20-17 [place], Randall An, MD September 20, 2017 [place], Randall An, MD Sep 20, 2017 [phone]

## 2017-09-11 NOTE — ED Notes (Signed)
Pt sister arrived to room

## 2017-09-11 NOTE — Code Documentation (Addendum)
Compressions held for pulse check, no pulses present, asystole on monitor, lucas applied. CPR resumed.

## 2017-09-11 NOTE — ED Notes (Signed)
Admitting MD, Dr. Anselm Jungling, at bedside speaking with family

## 2017-09-11 NOTE — ED Notes (Addendum)
Carotid pulse present and weak/thready strength

## 2017-09-11 NOTE — Code Documentation (Signed)
Dr. Clearnce Hasten at bedside preparing to intubate patient

## 2017-09-11 NOTE — ED Notes (Signed)
Patient bradying down into the 20's with pauses, zoll not picking up heart rate. Dr. Clearnce Hasten called to bedside. Pt became asystolic while Dr. Clearnce Hasten at bedside

## 2017-09-11 DEATH — deceased

## 2022-03-11 DEATH — deceased
